# Patient Record
Sex: Male | Born: 1960 | Hispanic: No | Marital: Married | Smoking: Never smoker
Health system: Southern US, Community
[De-identification: ages and names within clinical notes are randomized; demographics above are authoritative.]

## PROBLEM LIST (undated history)

## (undated) DIAGNOSIS — B181 Chronic viral hepatitis B without delta-agent: Secondary | ICD-10-CM

## (undated) DIAGNOSIS — I10 Essential (primary) hypertension: Secondary | ICD-10-CM

## (undated) DIAGNOSIS — E119 Type 2 diabetes mellitus without complications: Secondary | ICD-10-CM

---

## 2001-03-30 ENCOUNTER — Ambulatory Visit (HOSPITAL_BASED_OUTPATIENT_CLINIC_OR_DEPARTMENT_OTHER): Admission: RE | Admit: 2001-03-30 | Discharge: 2001-03-30 | Payer: Self-pay | Admitting: General Surgery

## 2001-03-30 ENCOUNTER — Encounter (INDEPENDENT_AMBULATORY_CARE_PROVIDER_SITE_OTHER): Payer: Self-pay | Admitting: *Deleted

## 2006-05-11 ENCOUNTER — Emergency Department (HOSPITAL_COMMUNITY): Admission: EM | Admit: 2006-05-11 | Discharge: 2006-05-11 | Payer: Self-pay | Admitting: Emergency Medicine

## 2009-12-10 ENCOUNTER — Encounter: Admission: RE | Admit: 2009-12-10 | Discharge: 2009-12-10 | Payer: Self-pay | Admitting: Gastroenterology

## 2011-04-04 NOTE — Op Note (Signed)
[place]. [facility]  Patient:    Randy Vaughan, Randy Vaughan                          MRN: [id number] Proc. Date: 03/30/01 Adm. Date:  [id number] Attending:  Fortino Sic                           Operative Report  PREOPERATIVE DIAGNOSIS:  Anal mass.  POSTOPERATIVE DIAGNOSIS:  Probable inflamed hemorrhoid.  OPERATION PERFORMED:  Proctohemorrhoidectomy.  SURGEON:  Marnee Spring. Wiliam Ke, M.D.  ASSISTANT:  None.  ANESTHESIA:  General.  DESCRIPTION OF PROCEDURE:  Under good general anesthesia and dorsal lithotomy position the skin of the perineum was prepped and draped in the usual sterile manner.  Procto to 15 cm was negative save for the outlet findings.  the patient had a large polypoid mass in the anal verge based near the posterior midline.  This went to the dentate line and out to the anoderm and appeared to be an inflamed piece of tissue with a fecalith involved.  It was approximately 2 x 2 cm.  The tissue was infiltrated with Xylocaine and Marcaine anesthesia with epinephrine.  A 3-0 chromic suture was placed proximally in the rectum. It was tied and held in place.  The mass was then excised and the wound that resulted was closed using the 3-0 chromic suture in a running interlocking fashion.  Hemostasis was good, the wound was irrigated and examined again. The rest of the anal circumference was normal.  The wound was dressed with Nupercainal ointment and 4 x 4.  Estimated blood loss for this procedure was minimal.  The patient received no blood.  He left the operating room in stable condition after sponge, needle counts were verified. DD:  03/30/01 TD:  03/30/01 Job: [id number]

## 2011-04-10 ENCOUNTER — Inpatient Hospital Stay (HOSPITAL_COMMUNITY)
Admission: EM | Admit: 2011-04-10 | Discharge: 2011-04-11 | DRG: 287 | Disposition: A | Discharge status: Home / Self Care | Payer: Self-pay | Attending: Cardiology | Admitting: Cardiology

## 2011-04-10 DIAGNOSIS — Z7982 Long term (current) use of aspirin: Secondary | ICD-10-CM

## 2011-04-10 DIAGNOSIS — K219 Gastro-esophageal reflux disease without esophagitis: Secondary | ICD-10-CM | POA: Diagnosis present

## 2011-04-10 DIAGNOSIS — I119 Hypertensive heart disease without heart failure: Principal | ICD-10-CM | POA: Diagnosis present

## 2011-04-11 LAB — POCT I-STAT, CHEM 8
Creatinine, Ser: 0.8 mg/dL (ref 0.4–1.5)
Glucose, Bld: 209 mg/dL — ABNORMAL HIGH (ref 70–99)
Hemoglobin: 15 g/dL (ref 13.0–17.0)
TCO2: 26 mmol/L (ref 0–100)

## 2011-04-11 LAB — BASIC METABOLIC PANEL
BUN: 11 mg/dL (ref 6–23)
CO2: 28 mEq/L (ref 19–32)
Chloride: 101 mEq/L (ref 96–112)
Creatinine, Ser: 0.86 mg/dL (ref 0.4–1.5)
GFR calc non Af Amer: >60 mL/min (ref >60)
Glucose, Bld: 210 mg/dL — ABNORMAL HIGH (ref 70–99)
Potassium: 4 mEq/L (ref 3.5–5.1)
Sodium: 137 mEq/L (ref 135–145)

## 2011-04-11 LAB — CBC
HCT: 35.5 % — ABNORMAL LOW (ref 39.0–52.0)
MCH: 22.9 pg — ABNORMAL LOW (ref 26.0–34.0)
MCH: 23.9 pg — ABNORMAL LOW (ref 26.0–34.0)
MCHC: 31.4 g/dL (ref 30.0–36.0)
MCHC: 32.8 g/dL (ref 30.0–36.0)
MCV: 73 fL — ABNORMAL LOW (ref 78.0–100.0)
MCV: 73.1 fL — ABNORMAL LOW (ref 78.0–100.0)
Platelets: 129 10*3/uL — ABNORMAL LOW (ref 150–400)
Platelets: 137 10*3/uL — ABNORMAL LOW (ref 150–400)
Platelets: 147 10*3/uL — ABNORMAL LOW (ref 150–400)
RBC: 4.93 MIL/uL (ref 4.22–5.81)
RDW: 15 % (ref 11.5–15.5)
RDW: 15.1 % (ref 11.5–15.5)
RDW: 15.1 % (ref 11.5–15.5)
WBC: 3 10*3/uL — ABNORMAL LOW (ref 4.0–10.5)

## 2011-04-11 LAB — LIPID PANEL: Triglycerides: 87 mg/dL (ref <150)

## 2011-04-11 LAB — CARDIAC PANEL(CRET KIN+CKTOT+MB+TROPI)
Relative Index: 0.8 (ref 0.0–2.5)
Total CK: 202 U/L (ref 7–232)
Total CK: 180 U/L (ref 7–232)
Troponin I: <0.3 ng/mL (ref <0.30)
Troponin I: <0.3 ng/mL (ref <0.30)

## 2011-04-11 LAB — COMPREHENSIVE METABOLIC PANEL
AST: 67 U/L — ABNORMAL HIGH (ref 0–37)
CO2: 26 mEq/L (ref 19–32)
Chloride: 102 mEq/L (ref 96–112)
Creatinine, Ser: 0.75 mg/dL (ref 0.4–1.5)
GFR calc Af Amer: >60 mL/min (ref >60)
GFR calc non Af Amer: >60 mL/min (ref >60)
Glucose, Bld: 207 mg/dL — ABNORMAL HIGH (ref 70–99)
Total Bilirubin: 0.3 mg/dL (ref 0.3–1.2)

## 2011-04-11 LAB — HEMOGLOBIN A1C: Hgb A1c MFr Bld: 5.3 % (ref <5.7)

## 2011-04-11 LAB — PROTIME-INR: Prothrombin Time: 15.2 seconds (ref 11.6–15.2)

## 2011-04-11 LAB — DIFFERENTIAL
Basophils Relative: 0 % (ref 0–1)
Eosinophils Absolute: 0.1 10*3/uL (ref 0.0–0.7)
Eosinophils Relative: 4 % (ref 0–5)
Lymphs Abs: 1.1 10*3/uL (ref 0.7–4.0)
Monocytes Relative: 16 % — ABNORMAL HIGH (ref 3–12)

## 2011-05-13 NOTE — H&P (Signed)
  [name], [name]                 ACCOUNT NO.:  [id number]  MEDICAL RECORD NO.:  [id number]           PATIENT TYPE:  I  LOCATION:  2039                         FACILITY:  [facility]  PHYSICIAN:  Pamella Pert, MD DATE OF BIRTH:  Jul 11, 1961  DATE OF ADMISSION:  04/10/2011 DATE OF DISCHARGE:                             HISTORY & PHYSICAL   REASON FOR ADMISSION:  Acute coronary syndrome.  HISTORY:  Mr. Randy Vaughan is a 50 year old gentleman, who has history of hypertension, was on medication for a short period of time, but has otherwise been doing well, who presently not on medications.  He was doing well until around 9 o'clock, felt nauseous and generally weak and tired.  Following this somewhere around 10 o'clock, he had chest tightness associated with nausea and vomiting and also had mild-to- marked diaphoresis.  He had called his friend and presented to the emergency room where EKG revealed minimal ST elevation 1 at aVL, which was not significant for an acute MI.  However, because of his typical symptoms and abnormal EKG, a STEMI was activated and I was called to see the patient.  The patient states he is presently chest pain-free and he feels well.  REVIEW OF SYSTEMS:  He denies any bowel or bladder disturbance, except for nausea, vomiting prior to the hospital presentation.  He has no neurological deficits.  No diabetes.  No bowel or bladder disturbance. Other systems are negative.  PRESENT MEDICATIONS:  None.  ALLERGIES:  No known drug allergies.  PAST MEDICAL HISTORY:  History of hypertension.  FAMILY HISTORY:  There is no history of premature coronary artery disease in the family.  SOCIAL HISTORY:  He is married, lives with his wife.  Does not drink alcohol.  Does not smoke tobacco.  PHYSICAL EXAM:  GENERAL:  He is well built, well nourished, he appears to be in no acute distress. VITAL SIGNS:  Include, temperature of 97.4, pulse is 51 beats per minute and  regular, respirations 14, blood pressure 163/93 mmHg. CARDIAC:  S1-S2 was normal without any gallops or murmur. CHEST:  Clear. ABDOMEN:  Soft. EXTREMITIES:  No edema.  Peripheral arterial examination was normal.  His labs are pending at this time.  Chest x-ray was within normal limits.  EKG demonstrate insignificant ST-elevation 1 at aVL, poor R-wave progression, presence of U-wave.  IMPRESSION: 1. Chest pain, appears atypical but cannot exclude unstable angina,     acute coronary syndrome. 2. Abnormal EKG.  RECOMMENDATION:  The patient will be taken to the cardiac catheterization lab.  One of his close friends is accompanying the patient.  The patient understand less than 1% risk of death, stroke, heart attack, urgent need for bypass surgery, bleeding, and infection, but not limited to these as major complication from coronary artery catheterization.     Pamella Pert, MD     JRG/MEDQ  D:  04/11/2011  T:  04/11/2011  Job:  [id number]  Electronically Signed by Yates Decamp MD on 05/13/2011 10:10:13 AM

## 2011-05-13 NOTE — Cardiovascular Report (Signed)
NAMERAGAN, REALE                 ACCOUNT NO.:  [id number]  MEDICAL RECORD NO.:  [id number]           PATIENT TYPE:  I  LOCATION:  2039                         FACILITY:  [facility]  PHYSICIAN:  Pamella Pert, MD DATE OF BIRTH:  1960/12/27  DATE OF PROCEDURE:  04/11/2011 DATE OF DISCHARGE:                           CARDIAC CATHETERIZATION   PROCEDURE PERFORMED: 1. Left ventriculography. 2. Selective right and left coronary arteriography. 3. Right femoral arteriography and closure of the right femoral     arterial access with Perclose.  INDICATIONS:  Mr. Maki Sweetser is a 50-year gentleman with a prior history of hypertension, who was present, not on medication, was admitted via emergency room with chest pain with nausea, vomiting, and diaphoresis.  EKG had demonstrated very minimal ST elevation 1 at aVL, which is not significant for a STEMI, but because of his atypical presentation a STEMI was activated.  He was brought to the cardiac catheterization lab to evaluate his coronary anatomy.  HEMODYNAMIC DATA:  The left ventricular pressure was 162/9 with end- diastolic pressure of 16 mmHg.  Aortic pressure was 164/96 with a mean of 125 mmHg.  There is no pressure gradient across the aortic valve.  ANGIOGRAPHIC DATA:  The left ventricular systolic function was normal with the ejection fraction of 60%-65% without any regional wall motion abnormality.  Right coronary artery:  The right coronary artery is a large-caliber vessel and a dominant vessel.  It is smooth and normal.  Left main coronary artery:  Left main coronary artery is a large-caliber vessel.  It is smooth and normal.  Circumflex coronary artery:  The circumflex coronary artery is a very large vessel.  It is smooth and norma.  Ramus intermediate:  Ramus intermedius is a very large vessel and has a bifurcating branch.  It is smooth and normal.  LAD:  LAD is a large-caliber vessel.  It gives origin to large  diagonal 1 and a moderate-sized diagonal 2.  It is smooth and normal.  IMPRESSION: 1. Normal coronary arteries and normal left ventricular systolic     function. 2. Elevated left ventricular end-diastolic pressure. 3. Abnormal EKG secondary to hypertension with hypertensive heart     disease.  RECOMMENDATIONS:  The patient will be admitted to the hospital and probably will be discharged home in the morning once his chest pain is resolved.  I suspect either hypertension with hypertensive heart disease and also GERD as an etiology for his chest pain.  TECHNIQUES OF THE PROCEDURE:  Under sterile precautions, using a 6- French right femoral arterial access, 6-French multipurpose B2 catheter was advanced into the ascending aorta, then into the left ventricle. Left ventriculography was performed in the RAO projection.  Catheter pulled into the ascending aorta.  Right coronary was selectively engaged and angiography was performed.  The catheter was then engaged to the left main coronary artery and angiography was performed.  Catheter was then pulled out of body and a 6-French Judkins left 4 diagnostic catheter was utilized to engage the left main coronary artery and angiography was performed.  Catheter was then pulled out of the body in  the usual fashion.  Right femoral arteriography was performed through the artery access sheath and access was closed with Angio-Seal with excellent hemostasis.  The patient tolerated the procedure well.  No immediate complications.     Pamella Pert, MD     JRG/MEDQ  D:  04/11/2011  T:  04/11/2011  Job:  [id number]  Electronically Signed by Yates Decamp MD on 05/13/2011 10:16:46 AM

## 2011-06-07 NOTE — Discharge Summary (Signed)
  Randy Vaughan, Randy Vaughan NO.:  [id number]  MEDICAL RECORD NO.:  [id number]  LOCATION:  2039                         FACILITY:  [facility]  PHYSICIAN:  Pamella Pert, MD DATE OF BIRTH:  05/04/1961  DATE OF ADMISSION:  04/10/2011 DATE OF DISCHARGE:  04/11/2011                              DISCHARGE SUMMARY   DISCHARGE DIAGNOSES: 1. Hypertension with hypertensive heart disease. 2. Abnormal EKG suggestive of left ventricular hypertrophy and early     repolarization. 3. Hyperglycemia with normal hemoglobin A1c.  DISCHARGE MEDICATIONS: 1. Aspirin 81 mg p.o. daily. 2. Amlodipine 10 mg p.o. daily. 3. Pepcid 40 mg p.o. daily. 4. Lisinopril/hydrochlorothiazide 10/12.5 one p.o. daily. 5. Metformin 500 mg p.o. daily.  RECOMMENDATIONS:  The patient will be discharged home today with outpatient followup with [facility] or his primary care physicians.  BRIEF HISTORY:  [name] is a 50 year old gentleman with history of diabetes and hypertension for which he has not taken any medications who was admitted to the hospital with chest pain.  In the emergency room, he was found to have ST-segment elevations versus early repolarization. Hence, he underwent cardiac catheterization in an urgent basis on Apr 11, 2011, which essentially revealed normal LV systolic function and normal coronary arteries.  He was admitted to the hospital and ruled out for myocardial infarction and he was essentially felt to be stable for discharge with control of hypertension.  Laboratory evaluation included a CBC which revealed a hemoglobin of 11.8, hematocrit of 36.0, white count was 3.8 with a platelet count of 147,000.  His cardiac markers were negative for myocardial injury.  His total cholesterol was 203, triglycerides 87, HDL 50, and LDL of 136. His hemoglobin A1c was normal at 5.3 with a mean plasma glucose of 105. However, fasting blood sugars were in the range of 209.  The patient was  discharged home with outpatient followup with [facility].  He will see [place] back on a p.r.n. basis.  Overall, the patient tolerated the procedure well during cardiac catheterization and did not have any periprocedural complications.     Pamella Pert, MD     JRG/MEDQ  D:  05/13/2011  T:  05/14/2011  Job:  [id number]  Electronically Signed by Yates Decamp MD on 06/07/2011 01:40:36 PM

## 2012-01-26 ENCOUNTER — Other Ambulatory Visit: Payer: Self-pay | Admitting: Gastroenterology

## 2012-01-26 DIAGNOSIS — B181 Chronic viral hepatitis B without delta-agent: Secondary | ICD-10-CM

## 2012-02-02 ENCOUNTER — Other Ambulatory Visit: Payer: Self-pay

## 2012-02-05 ENCOUNTER — Ambulatory Visit
Admission: RE | Admit: 2012-02-05 | Discharge: 2012-02-05 | Disposition: A | Discharge status: Home / Self Care | Payer: PRIVATE HEALTH INSURANCE | Source: Ambulatory Visit | Attending: Gastroenterology | Admitting: Gastroenterology

## 2012-02-05 DIAGNOSIS — B181 Chronic viral hepatitis B without delta-agent: Secondary | ICD-10-CM

## 2013-09-05 ENCOUNTER — Other Ambulatory Visit: Payer: Self-pay | Admitting: Gastroenterology

## 2013-09-05 DIAGNOSIS — B181 Chronic viral hepatitis B without delta-agent: Secondary | ICD-10-CM

## 2013-09-09 ENCOUNTER — Other Ambulatory Visit: Payer: PRIVATE HEALTH INSURANCE

## 2013-09-12 ENCOUNTER — Other Ambulatory Visit: Payer: Self-pay | Admitting: Gastroenterology

## 2013-09-12 DIAGNOSIS — R772 Abnormality of alphafetoprotein: Secondary | ICD-10-CM

## 2013-09-13 ENCOUNTER — Other Ambulatory Visit: Payer: Self-pay | Admitting: Gastroenterology

## 2013-09-13 DIAGNOSIS — R772 Abnormality of alphafetoprotein: Secondary | ICD-10-CM

## 2013-09-19 ENCOUNTER — Ambulatory Visit
Admission: RE | Admit: 2013-09-19 | Discharge: 2013-09-19 | Disposition: A | Discharge status: Home / Self Care | Payer: BC Managed Care – PPO | Source: Ambulatory Visit | Attending: Gastroenterology | Admitting: Gastroenterology

## 2013-09-19 DIAGNOSIS — R772 Abnormality of alphafetoprotein: Secondary | ICD-10-CM

## 2013-09-19 MED ORDER — IOHEXOL 350 MG/ML SOLN
125.0000 mL | Freq: Once | INTRAVENOUS | Status: AC | PRN
  Administered 2013-09-19: 125 mL via INTRAVENOUS

## 2014-07-05 ENCOUNTER — Other Ambulatory Visit (HOSPITAL_COMMUNITY): Payer: Self-pay | Admitting: Gastroenterology

## 2014-07-05 DIAGNOSIS — B181 Chronic viral hepatitis B without delta-agent: Secondary | ICD-10-CM

## 2014-07-06 ENCOUNTER — Encounter (HOSPITAL_COMMUNITY): Payer: Self-pay | Admitting: Pharmacy Technician

## 2014-07-07 ENCOUNTER — Other Ambulatory Visit: Payer: Self-pay | Admitting: Radiology

## 2014-07-10 ENCOUNTER — Ambulatory Visit (HOSPITAL_COMMUNITY)
Admission: RE | Admit: 2014-07-10 | Discharge: 2014-07-10 | Disposition: A | Discharge status: Home / Self Care | Payer: BC Managed Care – PPO | Source: Ambulatory Visit | Attending: Gastroenterology | Admitting: Gastroenterology

## 2014-07-10 ENCOUNTER — Encounter (HOSPITAL_COMMUNITY): Payer: Self-pay

## 2014-07-10 DIAGNOSIS — B181 Chronic viral hepatitis B without delta-agent: Secondary | ICD-10-CM | POA: Insufficient documentation

## 2014-07-10 HISTORY — DX: Chronic viral hepatitis B without delta-agent: B18.1

## 2014-07-10 LAB — CBC
HEMATOCRIT: 36.9 % — AB (ref 39.0–52.0)
Hemoglobin: 12 g/dL — ABNORMAL LOW (ref 13.0–17.0)
MCH: 23.9 pg — ABNORMAL LOW (ref 26.0–34.0)
MCHC: 32.5 g/dL (ref 30.0–36.0)
MCV: 73.4 fL — ABNORMAL LOW (ref 78.0–100.0)
PLATELETS: 96 10*3/uL — AB (ref 150–400)
RBC: 5.03 MIL/uL (ref 4.22–5.81)
RDW: 13.7 % (ref 11.5–15.5)
WBC: 2.2 10*3/uL — AB (ref 4.0–10.5)

## 2014-07-10 LAB — PROTIME-INR
INR: 1.19 (ref 0.00–1.49)
Prothrombin Time: 15.1 seconds (ref 11.6–15.2)

## 2014-07-10 LAB — APTT: aPTT: 34 seconds (ref 24–37)

## 2014-07-10 MED ORDER — LIDOCAINE-EPINEPHRINE 1 %-1:100000 IJ SOLN
INTRAMUSCULAR | Status: AC
  Filled 2014-07-10: qty 1

## 2014-07-10 MED ORDER — SODIUM CHLORIDE 0.9 % IV SOLN: Freq: Once | INTRAVENOUS | Status: DC

## 2014-07-10 MED ORDER — MIDAZOLAM HCL 2 MG/2ML IJ SOLN
INTRAMUSCULAR | Status: AC | PRN
  Administered 2014-07-10: 1 mg via INTRAVENOUS

## 2014-07-10 MED ORDER — FENTANYL CITRATE 0.05 MG/ML IJ SOLN
INTRAMUSCULAR | Status: AC | PRN
  Administered 2014-07-10: 50 ug via INTRAVENOUS

## 2014-07-10 MED ORDER — SODIUM CHLORIDE 0.9 % IV SOLN
INTRAVENOUS | Status: AC | PRN
  Administered 2014-07-10: 10 mL/h via INTRAVENOUS

## 2014-07-10 MED ORDER — GELATIN ABSORBABLE 12-7 MM EX MISC
CUTANEOUS | Status: AC
  Filled 2014-07-10: qty 1

## 2014-07-10 MED ORDER — FENTANYL CITRATE 0.05 MG/ML IJ SOLN
INTRAMUSCULAR | Status: AC
  Filled 2014-07-10: qty 2

## 2014-07-10 MED ORDER — MIDAZOLAM HCL 2 MG/2ML IJ SOLN
INTRAMUSCULAR | Status: AC
  Filled 2014-07-10: qty 4

## 2014-07-10 NOTE — H&P (Signed)
Chief Complaint: "I'm here for a liver biopsy" Referring Physician:Hung HPI: Randy Vaughan is an 53 y.o. male with chronic hep B. He is referred for US guided random liver core biopsy PMHx, meds reviewed. Pt has been NPO this am  Past Medical History:  Past Medical History  Diagnosis Date  . Hepatitis B, chronic     Past Surgical History: History reviewed. No pertinent past surgical history.  Family History: No family history on file.  Social History:  has no tobacco, alcohol, and drug history on file.  Allergies: No Known Allergies  Medications:   Medication List    ASK your doctor about these medications       hydrochlorothiazide 25 MG tablet  Commonly known as:  HYDRODIURIL  Take 25 mg by mouth daily.     lovastatin 20 MG tablet  Commonly known as:  MEVACOR  Take 20 mg by mouth at bedtime.        Please HPI for pertinent positives, otherwise complete 10 system ROS negative.  Physical Exam: BP 143/94  Pulse 68  Temp(Src) 98.2 F (36.8 C) (Oral)  Resp 20  Ht  (1.727 m)  Wt 217 lb (98.431 kg)  BMI 33.00 kg/m2  SpO2 100% Body mass index is 33 kg/(m^2).   General Appearance:  Alert, cooperative, no distress, appears stated age  Head:  Normocephalic, without obvious abnormality, atraumatic  ENT: Unremarkable  Neck: Supple, symmetrical, trachea midline  Lungs:   Clear to auscultation bilaterally, no w/r/r, respirations unlabored without use of accessory muscles.  Chest Wall:  No tenderness or deformity  Heart:  Regular rate and rhythm, S1, S2 normal, no murmur, rub or gallop.  Abdomen:   Soft, non-tender, non distended.  Neurologic: Normal affect, no gross deficits.   Results for orders placed during the hospital encounter of 07/10/14 (from the past 48 hour(s))  APTT     Status: None   Collection Time    07/10/14  8:40 AM      Result Value Ref Range   aPTT 34  24 - 37 seconds  CBC     Status: Abnormal (Preliminary result)   Collection Time   07/10/14  8:40 AM      Result Value Ref Range   WBC 2.2 (*) 4.0 - 10.5 K/uL   Comment: REPEATED TO VERIFY   RBC 5.03  4.22 - 5.81 MIL/uL   Hemoglobin 12.0 (*) 13.0 - 17.0 g/dL   Comment: REPEATED TO VERIFY   HCT 36.9 (*) 39.0 - 52.0 %   MCV 73.4 (*) 78.0 - 100.0 fL   MCH 23.9 (*) 26.0 - 34.0 pg   MCHC 32.5  30.0 - 36.0 g/dL   RDW 16.1  09.6 - 04.5 %   Platelets PENDING  150 - 400 K/uL  PROTIME-INR     Status: None   Collection Time    07/10/14  8:40 AM      Result Value Ref Range   Prothrombin Time 15.1  11.6 - 15.2 seconds   INR 1.19  0.00 - 1.49   No results found.  Assessment/Plan Chronic hep B For US guided random liver core biopsy Discussed procedure, risks, complications, use of sedation Labs reviewed Consent signed in chart  Brayton El PA-C 07/10/2014, 9:38 AM

## 2014-07-10 NOTE — Procedures (Signed)
Technically successful US guided biopsy of right lobe of liver.  No immediate complications.   

## 2014-07-10 NOTE — Discharge Instructions (Signed)
Liver Biopsy, Care After °These instructions give you information on caring for yourself after your procedure. Your doctor may also give you more specific instructions. Call your doctor if you have any problems or questions after your procedure. °HOME CARE °· Rest at home for 1-2 days or as told by your doctor. °· Have someone stay with you for at least 24 hours. °· Do not do these things in the first 24 hours: °¨ Drive. °¨ Use machinery. °¨ Take care of other people. °¨ Sign legal documents. °¨ Take a bath or shower. °· There are many different ways to close and cover a cut (incision). For example, a cut can be closed with stitches, skin glue, or adhesive strips. Follow your doctor's instructions on: °¨ Taking care of your cut. °¨ Changing and removing your bandage (dressing). °¨ Removing whatever was used to close your cut. °· Do not drink alcohol in the first week. °· Do not lift more than 5 pounds or play contact sports for the first 2 weeks. °· Take medicines only as told by your doctor. For 1 week, do not take medicine that has aspirin in it or medicines like ibuprofen. °· Get your test results. °GET HELP IF: °· A cut bleeds and leaves more than just a small spot of blood. °· A cut is red, puffs up (swells), or hurts more than before. °· Fluid or something else comes from a cut. °· A cut smells bad. °· You have a fever or chills. °GET HELP RIGHT AWAY IF: °· You have swelling, bloating, or pain in your belly (abdomen). °· You get dizzy or faint. °· You have a rash. °· You feel sick to your stomach (nauseous) or throw up (vomit). °· You have trouble breathing, feel short of breath, or feel faint. °· Your chest hurts. °· You have problems talking or seeing. °· You have trouble balancing or moving your arms or legs. °Document Released: 08/12/2008 Document Revised: 03/20/2014 Document Reviewed: 12/30/2013 °[employer]® Patient Information ©2015 [employer]. This information is not intended to replace advice given to  you by your health care provider. Make sure you discuss any questions you have with your health care provider. ° °

## 2014-11-23 ENCOUNTER — Other Ambulatory Visit: Payer: Self-pay | Admitting: Gastroenterology

## 2014-11-24 ENCOUNTER — Other Ambulatory Visit: Payer: Self-pay | Admitting: Gastroenterology

## 2014-11-24 DIAGNOSIS — R945 Abnormal results of liver function studies: Principal | ICD-10-CM

## 2014-11-24 DIAGNOSIS — B181 Chronic viral hepatitis B without delta-agent: Secondary | ICD-10-CM

## 2014-11-24 DIAGNOSIS — R7989 Other specified abnormal findings of blood chemistry: Secondary | ICD-10-CM

## 2014-12-01 ENCOUNTER — Other Ambulatory Visit: Payer: Self-pay

## 2014-12-05 ENCOUNTER — Ambulatory Visit
Admission: RE | Admit: 2014-12-05 | Discharge: 2014-12-05 | Disposition: A | Discharge status: Home / Self Care | Payer: BLUE CROSS/BLUE SHIELD | Source: Ambulatory Visit | Attending: Gastroenterology | Admitting: Gastroenterology

## 2014-12-05 DIAGNOSIS — B181 Chronic viral hepatitis B without delta-agent: Secondary | ICD-10-CM

## 2014-12-05 DIAGNOSIS — R7989 Other specified abnormal findings of blood chemistry: Secondary | ICD-10-CM

## 2014-12-05 DIAGNOSIS — R945 Abnormal results of liver function studies: Principal | ICD-10-CM

## 2014-12-05 MED ORDER — IOHEXOL 300 MG/ML  SOLN
125.0000 mL | Freq: Once | INTRAMUSCULAR | Status: AC | PRN
  Administered 2014-12-05: 125 mL via INTRAVENOUS

## 2018-03-01 ENCOUNTER — Other Ambulatory Visit: Payer: Self-pay | Admitting: Gastroenterology

## 2018-03-01 DIAGNOSIS — B1910 Unspecified viral hepatitis B without hepatic coma: Secondary | ICD-10-CM

## 2018-03-09 ENCOUNTER — Ambulatory Visit
Admission: RE | Admit: 2018-03-09 | Discharge: 2018-03-09 | Disposition: A | Discharge status: Home / Self Care | Payer: BLUE CROSS/BLUE SHIELD | Source: Ambulatory Visit | Attending: Gastroenterology | Admitting: Gastroenterology

## 2018-03-09 DIAGNOSIS — B1910 Unspecified viral hepatitis B without hepatic coma: Secondary | ICD-10-CM

## 2018-03-11 ENCOUNTER — Other Ambulatory Visit: Payer: Self-pay | Admitting: Gastroenterology

## 2018-03-11 DIAGNOSIS — K769 Liver disease, unspecified: Secondary | ICD-10-CM

## 2018-03-20 ENCOUNTER — Ambulatory Visit
Admission: RE | Admit: 2018-03-20 | Discharge: 2018-03-20 | Disposition: A | Discharge status: Home / Self Care | Payer: BLUE CROSS/BLUE SHIELD | Source: Ambulatory Visit | Attending: Gastroenterology | Admitting: Gastroenterology

## 2018-03-20 DIAGNOSIS — K769 Liver disease, unspecified: Secondary | ICD-10-CM

## 2018-03-20 MED ORDER — GADOBENATE DIMEGLUMINE 529 MG/ML IV SOLN
18.0000 mL | Freq: Once | INTRAVENOUS | Status: AC | PRN
  Administered 2018-03-20: 18 mL via INTRAVENOUS

## 2018-09-06 ENCOUNTER — Other Ambulatory Visit: Payer: Self-pay | Admitting: Gastroenterology

## 2018-09-06 DIAGNOSIS — K769 Liver disease, unspecified: Secondary | ICD-10-CM

## 2018-09-20 ENCOUNTER — Other Ambulatory Visit: Payer: BLUE CROSS/BLUE SHIELD

## 2018-10-11 ENCOUNTER — Other Ambulatory Visit: Payer: BLUE CROSS/BLUE SHIELD

## 2019-03-30 ENCOUNTER — Inpatient Hospital Stay (HOSPITAL_COMMUNITY): Payer: BLUE CROSS/BLUE SHIELD

## 2019-03-30 ENCOUNTER — Inpatient Hospital Stay (HOSPITAL_COMMUNITY)
Admission: EM | Admit: 2019-03-30 | Discharge: 2019-04-18 | DRG: 871 | Disposition: E | Payer: BLUE CROSS/BLUE SHIELD | Attending: Internal Medicine | Admitting: Internal Medicine

## 2019-03-30 ENCOUNTER — Encounter (HOSPITAL_COMMUNITY): Payer: Self-pay | Admitting: Emergency Medicine

## 2019-03-30 ENCOUNTER — Other Ambulatory Visit: Payer: Self-pay

## 2019-03-30 ENCOUNTER — Emergency Department (HOSPITAL_COMMUNITY): Payer: BLUE CROSS/BLUE SHIELD

## 2019-03-30 DIAGNOSIS — E7220 Disorder of urea cycle metabolism, unspecified: Secondary | ICD-10-CM | POA: Diagnosis not present

## 2019-03-30 DIAGNOSIS — B1910 Unspecified viral hepatitis B without hepatic coma: Secondary | ICD-10-CM

## 2019-03-30 DIAGNOSIS — R6521 Severe sepsis with septic shock: Secondary | ICD-10-CM | POA: Diagnosis not present

## 2019-03-30 DIAGNOSIS — D61818 Other pancytopenia: Secondary | ICD-10-CM | POA: Diagnosis present

## 2019-03-30 DIAGNOSIS — E8770 Fluid overload, unspecified: Secondary | ICD-10-CM | POA: Diagnosis present

## 2019-03-30 DIAGNOSIS — I4891 Unspecified atrial fibrillation: Secondary | ICD-10-CM | POA: Diagnosis present

## 2019-03-30 DIAGNOSIS — B181 Chronic viral hepatitis B without delta-agent: Secondary | ICD-10-CM | POA: Diagnosis present

## 2019-03-30 DIAGNOSIS — A4150 Gram-negative sepsis, unspecified: Secondary | ICD-10-CM | POA: Diagnosis present

## 2019-03-30 DIAGNOSIS — M7989 Other specified soft tissue disorders: Secondary | ICD-10-CM

## 2019-03-30 DIAGNOSIS — Z79899 Other long term (current) drug therapy: Secondary | ICD-10-CM | POA: Diagnosis not present

## 2019-03-30 DIAGNOSIS — N179 Acute kidney failure, unspecified: Secondary | ICD-10-CM | POA: Diagnosis present

## 2019-03-30 DIAGNOSIS — E871 Hypo-osmolality and hyponatremia: Secondary | ICD-10-CM | POA: Diagnosis present

## 2019-03-30 DIAGNOSIS — R000 Tachycardia, unspecified: Secondary | ICD-10-CM

## 2019-03-30 DIAGNOSIS — N3941 Urge incontinence: Secondary | ICD-10-CM

## 2019-03-30 DIAGNOSIS — K7460 Unspecified cirrhosis of liver: Secondary | ICD-10-CM | POA: Diagnosis present

## 2019-03-30 DIAGNOSIS — E875 Hyperkalemia: Secondary | ICD-10-CM | POA: Diagnosis not present

## 2019-03-30 DIAGNOSIS — Z20828 Contact with and (suspected) exposure to other viral communicable diseases: Secondary | ICD-10-CM | POA: Diagnosis present

## 2019-03-30 DIAGNOSIS — D689 Coagulation defect, unspecified: Secondary | ICD-10-CM | POA: Diagnosis not present

## 2019-03-30 DIAGNOSIS — Z4659 Encounter for fitting and adjustment of other gastrointestinal appliance and device: Secondary | ICD-10-CM

## 2019-03-30 DIAGNOSIS — E11649 Type 2 diabetes mellitus with hypoglycemia without coma: Secondary | ICD-10-CM | POA: Diagnosis not present

## 2019-03-30 DIAGNOSIS — Z7984 Long term (current) use of oral hypoglycemic drugs: Secondary | ICD-10-CM

## 2019-03-30 DIAGNOSIS — E1165 Type 2 diabetes mellitus with hyperglycemia: Secondary | ICD-10-CM

## 2019-03-30 DIAGNOSIS — K7469 Other cirrhosis of liver: Secondary | ICD-10-CM | POA: Diagnosis not present

## 2019-03-30 DIAGNOSIS — M79661 Pain in right lower leg: Secondary | ICD-10-CM

## 2019-03-30 DIAGNOSIS — K7290 Hepatic failure, unspecified without coma: Secondary | ICD-10-CM

## 2019-03-30 DIAGNOSIS — R601 Generalized edema: Secondary | ICD-10-CM | POA: Diagnosis present

## 2019-03-30 DIAGNOSIS — R188 Other ascites: Secondary | ICD-10-CM | POA: Diagnosis present

## 2019-03-30 DIAGNOSIS — I469 Cardiac arrest, cause unspecified: Secondary | ICD-10-CM | POA: Diagnosis not present

## 2019-03-30 DIAGNOSIS — M79662 Pain in left lower leg: Secondary | ICD-10-CM

## 2019-03-30 DIAGNOSIS — Z452 Encounter for adjustment and management of vascular access device: Secondary | ICD-10-CM

## 2019-03-30 DIAGNOSIS — K7200 Acute and subacute hepatic failure without coma: Secondary | ICD-10-CM | POA: Diagnosis present

## 2019-03-30 DIAGNOSIS — B182 Chronic viral hepatitis C: Secondary | ICD-10-CM | POA: Diagnosis present

## 2019-03-30 DIAGNOSIS — I471 Supraventricular tachycardia: Secondary | ICD-10-CM | POA: Diagnosis not present

## 2019-03-30 DIAGNOSIS — M25511 Pain in right shoulder: Secondary | ICD-10-CM | POA: Diagnosis present

## 2019-03-30 DIAGNOSIS — J9601 Acute respiratory failure with hypoxia: Secondary | ICD-10-CM | POA: Diagnosis not present

## 2019-03-30 DIAGNOSIS — I10 Essential (primary) hypertension: Secondary | ICD-10-CM | POA: Diagnosis present

## 2019-03-30 DIAGNOSIS — J9690 Respiratory failure, unspecified, unspecified whether with hypoxia or hypercapnia: Secondary | ICD-10-CM

## 2019-03-30 HISTORY — DX: Type 2 diabetes mellitus without complications: E11.9

## 2019-03-30 HISTORY — DX: Essential (primary) hypertension: I10

## 2019-03-30 LAB — GLUCOSE, CAPILLARY
Glucose-Capillary: 72 mg/dL (ref 70–99)
Glucose-Capillary: 37 mg/dL — CL (ref 70–99)
Glucose-Capillary: 34 mg/dL — CL (ref 70–99)
Glucose-Capillary: 98 mg/dL (ref 70–99)

## 2019-03-30 LAB — URINALYSIS, ROUTINE W REFLEX MICROSCOPIC
Glucose, UA: >=500 mg/dL — AB
Ketones, ur: NEGATIVE mg/dL
Leukocytes,Ua: NEGATIVE
Nitrite: NEGATIVE
Protein, ur: 30 mg/dL — AB
Specific Gravity, Urine: 1.023 (ref 1.005–1.030)
pH: 5 (ref 5.0–8.0)

## 2019-03-30 LAB — CBC WITH DIFFERENTIAL/PLATELET
Abs Immature Granulocytes: 0.32 10*3/uL — ABNORMAL HIGH (ref 0.00–0.07)
Basophils Absolute: 0 10*3/uL (ref 0.0–0.1)
Basophils Relative: 1 %
Eosinophils Absolute: 0.8 10*3/uL — ABNORMAL HIGH (ref 0.0–0.5)
Eosinophils Relative: 27 %
HCT: 27.7 % — ABNORMAL LOW (ref 39.0–52.0)
Hemoglobin: 9.3 g/dL — ABNORMAL LOW (ref 13.0–17.0)
Immature Granulocytes: 11 %
Lymphocytes Relative: 8 %
Lymphs Abs: 0.3 10*3/uL — ABNORMAL LOW (ref 0.7–4.0)
MCH: 25.1 pg — ABNORMAL LOW (ref 26.0–34.0)
MCHC: 33.6 g/dL (ref 30.0–36.0)
MCV: 74.9 fL — ABNORMAL LOW (ref 80.0–100.0)
Monocytes Absolute: 0.2 10*3/uL (ref 0.1–1.0)
Monocytes Relative: 7 %
Neutro Abs: 1.4 10*3/uL — ABNORMAL LOW (ref 1.7–7.7)
Neutrophils Relative %: 46 %
Platelets: 54 10*3/uL — ABNORMAL LOW (ref 150–400)
RBC: 3.7 MIL/uL — ABNORMAL LOW (ref 4.22–5.81)
RDW: 20.7 % — ABNORMAL HIGH (ref 11.5–15.5)
WBC: 3 10*3/uL — ABNORMAL LOW (ref 4.0–10.5)
nRBC: 0 % (ref 0.0–0.2)

## 2019-03-30 LAB — COMPREHENSIVE METABOLIC PANEL
ALT: 78 U/L — ABNORMAL HIGH (ref 0–44)
ALT: 71 U/L — ABNORMAL HIGH (ref 0–44)
AST: 124 U/L — ABNORMAL HIGH (ref 15–41)
AST: 117 U/L — ABNORMAL HIGH (ref 15–41)
Albumin: 1.7 g/dL — ABNORMAL LOW (ref 3.5–5.0)
Albumin: 1.7 g/dL — ABNORMAL LOW (ref 3.5–5.0)
Alkaline Phosphatase: 96 U/L (ref 38–126)
Alkaline Phosphatase: 70 U/L (ref 38–126)
Anion gap: 16 — ABNORMAL HIGH (ref 5–15)
Anion gap: 18 — ABNORMAL HIGH (ref 5–15)
BUN: 33 mg/dL — ABNORMAL HIGH (ref 6–20)
BUN: 42 mg/dL — ABNORMAL HIGH (ref 6–20)
CO2: 13 mmol/L — ABNORMAL LOW (ref 22–32)
CO2: 11 mmol/L — ABNORMAL LOW (ref 22–32)
Calcium: 8.1 mg/dL — ABNORMAL LOW (ref 8.9–10.3)
Calcium: 8.2 mg/dL — ABNORMAL LOW (ref 8.9–10.3)
Chloride: 102 mmol/L (ref 98–111)
Chloride: 103 mmol/L (ref 98–111)
Creatinine, Ser: 1.87 mg/dL — ABNORMAL HIGH (ref 0.61–1.24)
Creatinine, Ser: 2.72 mg/dL — ABNORMAL HIGH (ref 0.61–1.24)
GFR calc Af Amer: 45 mL/min — ABNORMAL LOW (ref >60)
GFR calc Af Amer: 28 mL/min — ABNORMAL LOW (ref >60)
GFR calc non Af Amer: 38 mL/min — ABNORMAL LOW (ref >60)
GFR calc non Af Amer: 24 mL/min — ABNORMAL LOW (ref >60)
Glucose, Bld: 305 mg/dL — ABNORMAL HIGH (ref 70–99)
Glucose, Bld: 113 mg/dL — ABNORMAL HIGH (ref 70–99)
Potassium: 4.2 mmol/L (ref 3.5–5.1)
Potassium: 4.2 mmol/L (ref 3.5–5.1)
Sodium: 131 mmol/L — ABNORMAL LOW (ref 135–145)
Sodium: 132 mmol/L — ABNORMAL LOW (ref 135–145)
Total Bilirubin: 7.5 mg/dL — ABNORMAL HIGH (ref 0.3–1.2)
Total Bilirubin: 7 mg/dL — ABNORMAL HIGH (ref 0.3–1.2)
Total Protein: 6.6 g/dL (ref 6.5–8.1)
Total Protein: 6.2 g/dL — ABNORMAL LOW (ref 6.5–8.1)

## 2019-03-30 LAB — CBG MONITORING, ED
Glucose-Capillary: 191 mg/dL — ABNORMAL HIGH (ref 70–99)
Glucose-Capillary: 129 mg/dL — ABNORMAL HIGH (ref 70–99)
Glucose-Capillary: 104 mg/dL — ABNORMAL HIGH (ref 70–99)
Glucose-Capillary: 44 mg/dL — CL (ref 70–99)
Glucose-Capillary: 37 mg/dL — CL (ref 70–99)
Glucose-Capillary: 94 mg/dL (ref 70–99)

## 2019-03-30 LAB — SARS CORONAVIRUS 2 BY RT PCR (HOSPITAL ORDER, PERFORMED IN ~~LOC~~ HOSPITAL LAB): SARS Coronavirus 2: NEGATIVE

## 2019-03-30 LAB — PROTIME-INR
INR: 2.9 — ABNORMAL HIGH (ref 0.8–1.2)
Prothrombin Time: 30.1 seconds — ABNORMAL HIGH (ref 11.4–15.2)

## 2019-03-30 LAB — CK: Total CK: 124 U/L (ref 49–397)

## 2019-03-30 LAB — HEMOGLOBIN A1C
Hgb A1c MFr Bld: 8.3 % — ABNORMAL HIGH (ref 4.8–5.6)
Mean Plasma Glucose: 191.51 mg/dL

## 2019-03-30 LAB — MRSA PCR SCREENING: MRSA by PCR: NEGATIVE

## 2019-03-30 LAB — TROPONIN I: Troponin I: <0.03 ng/mL (ref <0.03)

## 2019-03-30 LAB — AMMONIA: Ammonia: 60 umol/L — ABNORMAL HIGH (ref 9–35)

## 2019-03-30 MED ORDER — SODIUM CHLORIDE 0.9 % IV SOLN: INTRAVENOUS | Status: DC

## 2019-03-30 MED ORDER — ALBUMIN HUMAN 5 % IV SOLN
12.5000 g | Freq: Four times a day (QID) | INTRAVENOUS | Status: AC
  Administered 2019-03-30 (×2): 12.5 g via INTRAVENOUS
  Filled 2019-03-30 (×2): qty 250

## 2019-03-30 MED ORDER — AMIODARONE LOAD VIA INFUSION
150.0000 mg | Freq: Once | INTRAVENOUS | Status: DC
  Filled 2019-03-30: qty 83.34

## 2019-03-30 MED ORDER — SODIUM CHLORIDE 0.9 % IV SOLN
2.0000 g | INTRAVENOUS | Status: DC
  Administered 2019-03-31: 2 g via INTRAVENOUS
  Filled 2019-03-30: qty 20

## 2019-03-30 MED ORDER — OCTREOTIDE ACETATE 100 MCG/ML IJ SOLN
100.0000 ug | Freq: Three times a day (TID) | INTRAMUSCULAR | Status: DC
  Administered 2019-03-30: 100 ug via SUBCUTANEOUS
  Filled 2019-03-30 (×4): qty 1

## 2019-03-30 MED ORDER — INSULIN ASPART 100 UNIT/ML ~~LOC~~ SOLN
0.0000 [IU] | SUBCUTANEOUS | Status: DC
  Administered 2019-03-30: 2 [IU] via SUBCUTANEOUS
  Administered 2019-03-30: 11:00:00 3 [IU] via SUBCUTANEOUS

## 2019-03-30 MED ORDER — ALBUMIN HUMAN 5 % IV SOLN
12.5000 g | Freq: Once | INTRAVENOUS | Status: DC
  Filled 2019-03-30: qty 250

## 2019-03-30 MED ORDER — SODIUM CHLORIDE 0.9 % IV BOLUS
500.0000 mL | Freq: Once | INTRAVENOUS | Status: AC
  Administered 2019-03-30: 500 mL via INTRAVENOUS

## 2019-03-30 MED ORDER — RIFAXIMIN 550 MG PO TABS
550.0000 mg | ORAL_TABLET | Freq: Two times a day (BID) | ORAL | Status: DC
  Administered 2019-03-30: 21:00:00 550 mg via ORAL
  Filled 2019-03-30: qty 1

## 2019-03-30 MED ORDER — ALBUMIN HUMAN 5 % IV SOLN
12.5000 g | Freq: Once | INTRAVENOUS | Status: AC
  Administered 2019-03-30: 12.5 g via INTRAVENOUS
  Filled 2019-03-30: qty 250

## 2019-03-30 MED ORDER — AMIODARONE HCL IN DEXTROSE 360-4.14 MG/200ML-% IV SOLN
60.0000 mg/h | INTRAVENOUS | Status: DC
  Filled 2019-03-30: qty 200

## 2019-03-30 MED ORDER — TENOFOVIR ALAFENAMIDE FUMARATE 25 MG PO TABS
1.0000 | ORAL_TABLET | Freq: Every day | ORAL | Status: DC
  Filled 2019-03-30: qty 1

## 2019-03-30 MED ORDER — DILTIAZEM HCL-DEXTROSE 100-5 MG/100ML-% IV SOLN (PREMIX): 5.0000 mg/h | INTRAVENOUS | Status: DC

## 2019-03-30 MED ORDER — DEXTROSE 10 % IV SOLN
INTRAVENOUS | Status: DC
  Administered 2019-03-30: via INTRAVENOUS

## 2019-03-30 MED ORDER — DEXTROSE 50 % IV SOLN
50.0000 mL | Freq: Once | INTRAVENOUS | Status: AC
  Administered 2019-03-30: 50 mL via INTRAVENOUS
  Filled 2019-03-30: qty 50

## 2019-03-30 MED ORDER — DEXTROSE 10 % IV SOLN
INTRAVENOUS | Status: AC
  Administered 2019-03-30: 18:00:00 via INTRAVENOUS

## 2019-03-30 MED ORDER — IOHEXOL 350 MG/ML SOLN
80.0000 mL | Freq: Once | INTRAVENOUS | Status: AC | PRN
  Administered 2019-03-30: 80 mL via INTRAVENOUS

## 2019-03-30 MED ORDER — ALBUMIN HUMAN 5 % IV SOLN: 12.5000 g | Freq: Four times a day (QID) | INTRAVENOUS | Status: DC

## 2019-03-30 MED ORDER — DEXTROSE 50 % IV SOLN
INTRAVENOUS | Status: AC
  Administered 2019-03-30: 50 mL
  Filled 2019-03-30: qty 50

## 2019-03-30 MED ORDER — AMIODARONE HCL IN DEXTROSE 360-4.14 MG/200ML-% IV SOLN
30.0000 mg/h | INTRAVENOUS | Status: DC
  Filled 2019-03-30: qty 200

## 2019-03-30 MED ORDER — MIDODRINE HCL 5 MG PO TABS
5.0000 mg | ORAL_TABLET | Freq: Three times a day (TID) | ORAL | Status: DC
  Filled 2019-03-30 (×3): qty 1

## 2019-03-30 MED ORDER — PNEUMOCOCCAL VAC POLYVALENT 25 MCG/0.5ML IJ INJ: 0.5000 mL | INJECTION | INTRAMUSCULAR | Status: DC

## 2019-03-30 MED ORDER — ENSURE ENLIVE PO LIQD: 237.0000 mL | Freq: Two times a day (BID) | ORAL | Status: DC

## 2019-03-30 NOTE — ED Notes (Signed)
Confirm with Fleet Contras, pharmd that albumin and 10% dextrose are compatible

## 2019-03-30 NOTE — ED Notes (Signed)
ED TO INPATIENT HANDOFF REPORT  ED Nurse Name and Phone #: Romeo Apple [phone]  S Name/Age/Gender Randy Vaughan 58 y.o. male Room/Bed: 025C/025C  Code Status   Code Status: Prior  Home/SNF/Other Home Patient oriented to: self, place, time and situation Is this baseline? Yes   Triage Complete: Triage complete  Chief Complaint shoulder pain  Triage Note From home with c/o of right shoulder pain X 2 days and soreness to both right and left calves.    Allergies No Known Allergies  Level of Care/Admitting Diagnosis ED Disposition    None      B Medical/Surgery History Past Medical History:  Diagnosis Date  . Diabetes mellitus without complication (HCC)   . Hepatitis B, chronic (HCC)   . Hypertension    History reviewed. No pertinent surgical history.   A IV Location/Drains/Wounds Patient Lines/Drains/Airways Status   Active Line/Drains/Airways    Name:   Placement date:   Placement time:   Site:   Days:   Peripheral IV 03/21/2019 Right;Upper Forearm   04/12/2019    0336    Forearm   less than 1   Incision (Closed) 07/10/14 Abdomen Right;Lateral   07/10/14    1024     1724          Intake/Output Last 24 hours No intake or output data in the 24 hours ending 03/21/2019 4540  Labs/Imaging Results for orders placed or performed during the hospital encounter of 04/15/2019 (from the past 48 hour(s))  CBC with Differential/Platelet     Status: Abnormal   Collection Time: 03/24/2019  3:23 AM  Result Value Ref Range   WBC 3.0 (L) 4.0 - 10.5 K/uL   RBC 3.70 (L) 4.22 - 5.81 MIL/uL   Hemoglobin 9.3 (L) 13.0 - 17.0 g/dL   HCT 98.1 (L) 19.1 - 47.8 %   MCV 74.9 (L) 80.0 - 100.0 fL   MCH 25.1 (L) 26.0 - 34.0 pg   MCHC 33.6 30.0 - 36.0 g/dL   RDW 29.5 (H) 62.1 - 30.8 %   Platelets 54 (L) 150 - 400 K/uL    Comment: REPEATED TO VERIFY PLATELET COUNT CONFIRMED BY SMEAR SPECIMEN CHECKED FOR CLOTS Immature Platelet Fraction may be clinically indicated, consider ordering this  additional test [id number]    nRBC 0.0 0.0 - 0.2 %   Neutrophils Relative % 46 %   Neutro Abs 1.4 (L) 1.7 - 7.7 K/uL   Lymphocytes Relative 8 %   Lymphs Abs 0.3 (L) 0.7 - 4.0 K/uL   Monocytes Relative 7 %   Monocytes Absolute 0.2 0.1 - 1.0 K/uL   Eosinophils Relative 27 %   Eosinophils Absolute 0.8 (H) 0.0 - 0.5 K/uL   Basophils Relative 1 %   Basophils Absolute 0.0 0.0 - 0.1 K/uL   WBC Morphology MILD LEFT SHIFT (1-5% METAS, OCC MYELO, OCC BANDS)    Immature Granulocytes 11 %   Abs Immature Granulocytes 0.32 (H) 0.00 - 0.07 K/uL   Polychromasia PRESENT    Target Cells PRESENT     Comment: Performed at [facility], [address], [place], [place] [address]  Troponin I - ONCE - STAT     Status: None   Collection Time: 04/12/2019  3:23 AM  Result Value Ref Range   Troponin I <0.03 <0.03 ng/mL    Comment: Performed at [facility], [address], [place], [place] [address]  Comprehensive metabolic panel     Status: Abnormal   Collection Time: 04/15/2019  3:23 AM  Result Value Ref Range   Sodium 131 (L) 135 - 145 mmol/L   Potassium 4.2 3.5 - 5.1 mmol/L   Chloride 102 98 - 111 mmol/L   CO2 13 (L) 22 - 32 mmol/L   Glucose, Bld 305 (H) 70 - 99 mg/dL   BUN 33 (H) 6 - 20 mg/dL   Creatinine, Ser 1.61 (H) 0.61 - 1.24 mg/dL   Calcium 8.1 (L) 8.9 - 10.3 mg/dL   Total Protein 6.6 6.5 - 8.1 g/dL   Albumin 1.7 (L) 3.5 - 5.0 g/dL   AST 096 (H) 15 - 41 U/L   ALT 78 (H) 0 - 44 U/L   Alkaline Phosphatase 96 38 - 126 U/L   Total Bilirubin 7.5 (H) 0.3 - 1.2 mg/dL   GFR calc non Af Amer 38 (L) >60 mL/min   GFR calc Af Amer 45 (L) >60 mL/min   Anion gap 16 (H) 5 - 15    Comment: Performed at [facility], [address] [place], [place] [address]  CK     Status: None   Collection Time: 03/29/2019  3:23 AM  Result Value Ref Range   Total CK 124 49 - 397 U/L    Comment: Performed at [facility], [address] [place] [address] [place], [place] [address]  Protime-INR     Status: Abnormal    Collection Time: 04/03/2019  3:23 AM  Result Value Ref Range   Prothrombin Time 30.1 (H) 11.4 - 15.2 seconds   INR 2.9 (H) 0.8 - 1.2    Comment: (NOTE) INR goal varies based on device and disease states. Performed at [facility], [address] [place], [place] [address]   Ammonia     Status: Abnormal   Collection Time: 04/05/2019  3:40 AM  Result Value Ref Range   Ammonia 60 (H) 9 - 35 umol/L    Comment: Performed at [facility], [address] [place], [place] [address]  SARS Coronavirus 2 [facility] order, Performed in [facility] hospital lab)     Status: None   Collection Time: 03/18/2019  3:40 AM  Result Value Ref Range   SARS Coronavirus 2 NEGATIVE NEGATIVE    Comment: (NOTE) If result is NEGATIVE SARS-CoV-2 target nucleic acids are NOT DETECTED. The SARS-CoV-2 RNA is generally detectable in upper and lower  respiratory specimens during the acute phase of infection. The lowest  concentration of SARS-CoV-2 viral copies this assay can detect is 250  copies / mL. A negative result does not preclude SARS-CoV-2 infection  and should not be used as the sole basis for treatment or other  patient management decisions.  A negative result may occur with  improper specimen collection / handling, submission of specimen other  than nasopharyngeal swab, presence of viral mutation(s) within the  areas targeted by this assay, and inadequate number of viral copies  (<250 copies / mL). A negative result must be combined with clinical  observations, patient history, and epidemiological information. If result is POSITIVE SARS-CoV-2 target nucleic acids are DETECTED. The SARS-CoV-2 RNA is generally detectable in upper and lower  respiratory specimens dur ing the acute phase of infection.  Positive  results are indicative of active infection with SARS-CoV-2.  Clinical  correlation with patient history and other diagnostic information is  necessary to determine patient infection  status.  Positive results do  not rule out bacterial infection or co-infection with other viruses. If result is PRESUMPTIVE POSTIVE  SARS-CoV-2 nucleic acids MAY BE PRESENT.   A presumptive positive result was obtained on the submitted specimen  and confirmed on repeat testing.  While 2019 novel coronavirus  (SARS-CoV-2) nucleic acids may be present in the submitted sample  additional confirmatory testing may be necessary for epidemiological  and / or clinical management purposes  to differentiate between  SARS-CoV-2 and other Sarbecovirus currently known to infect humans.  If clinically indicated additional testing with an alternate test  methodology [phone]) is advised. The SARS-CoV-2 RNA is generally  detectable in upper and lower respiratory sp ecimens during the acute  phase of infection. The expected result is Negative. Fact Sheet for Patients:  BoilerBrush.com.cy Fact Sheet for Healthcare Providers: https://pope.com/ This test is not yet approved or cleared by the [place] FDA and has been authorized for detection and/or diagnosis of SARS-CoV-2 by FDA under an Emergency Use Authorization (EUA).  This EUA will remain in effect (meaning this test can be used) for the duration of the COVID-19 declaration under Section 564(b)(1) of the Act, 21 U.S.C. section 360bbb-3(b)(1), unless the authorization is terminated or revoked sooner. Performed at [facility], [address], [place], [place] [address]    Ct Angio Chest Pe W And/or Wo Contrast  Result Date: 04/16/2019 CLINICAL DATA:  58 year old male with right side chest pain and shortness of breath for 2 days. Bilateral lower extremity swelling. EXAM: CT ANGIOGRAPHY CHEST WITH CONTRAST TECHNIQUE: Multidetector CT imaging of the chest was performed using the standard protocol during bolus administration of intravenous contrast. Multiplanar CT image reconstructions and MIPs  were obtained to evaluate the vascular anatomy. CONTRAST:  15mL OMNIPAQUE IOHEXOL 350 MG/ML SOLN COMPARISON:  Portable chest earlier today. CT Abdomen and Pelvis 12/05/2014. FINDINGS: Cardiovascular: Adequate contrast bolus timing in the pulmonary arterial tree. Mild respiratory motion. No focal filling defect identified in the pulmonary arteries to suggest acute pulmonary embolism. Cardiomegaly with no pericardial effusion. Negative visible aorta aside from mild calcified atherosclerosis. Mediastinum/Nodes: Negative. Lungs/Pleura: Low lung volumes, lower than in 2016. Crowding of bronchovascular structures bilaterally. Questionable mild septal thickening. No pleural effusion or consolidation. There is a 6 millimeter right lower lobe lung nodule on image 87 of series 6. There is mild thickening along the right minor fissure. Upper Abdomen: Streak artifact in the upper abdomen. There is a small volume of ascites in the upper abdomen which is new since 2016. Hepatic cirrhosis demonstrated on the prior CT. No focal abnormality of the visible liver, spleen, pancreas, adrenal glands or kidneys. Poor definition of the gallbladder. Mild wall thickening of decompressed transverse colon. Musculoskeletal: No acute osseous abnormality identified. Review of the MIP images confirms the above findings. IMPRESSION: 1. No acute pulmonary embolus identified. 2. Low lung volumes. Questionable pulmonary septal thickening. Consider mild interstitial edema. 3. New small volume ascites in the upper abdomen since 2016, most likely related to progressed cirrhosis given findings on the prior CT Abdomen and Pelvis. Electronically Signed   By: Odessa Fleming M.D.   On: 04/04/2019 05:56   Dg Chest Portable 1 View  Result Date: 04/02/2019 CLINICAL DATA:  Hypotension and shoulder pain EXAM: PORTABLE CHEST 1 VIEW COMPARISON:  None. FINDINGS: The heart size and mediastinal contours are within normal limits. Both lungs are clear. The visualized  skeletal structures are unremarkable. IMPRESSION: No active disease. Electronically Signed   By: Deatra Robinson M.D.   On: 04/04/2019 03:58    Pending Labs [employer] (From admission, onward)    Start  Ordered   June 24, 2019 0329  Urinalysis, Routine w reflex microscopic  Once,   R     June 24, 2019 0328          Vitals/Pain Today's Vitals   June 24, 2019 0324 June 24, 2019 0400 June 24, 2019 0446 June 24, 2019 0500  BP: (!) 82/58 (!) 78/51 (!) 82/55 (!) 82/54  Pulse:  (!) 112 (!) 101 (!) 117  Resp: (!) 22 (!) 27 (!) 21 (!) 22  Temp:      TempSrc:      SpO2:  98% 99% 94%  PainSc:        Isolation Precautions Droplet and Contact precautions  Medications Medications  0.9 %  sodium chloride infusion (has no administration in time range)  sodium chloride 0.9 % bolus 500 mL (0 mLs Intravenous Stopped 04-07-2019 0627)  iohexol (OMNIPAQUE) 350 MG/ML injection 80 mL (80 mLs Intravenous Contrast Given 04-07-2019 0534)    Mobility walks Low fall risk   Focused Assessments Hypotensive, Renal Failluire   R Recommendations: See Admitting Provider Note  Report given to:   Additional Notes: N/A

## 2019-03-30 NOTE — ED Notes (Signed)
Cbg of 44, pt given two OJ's

## 2019-03-30 NOTE — ED Notes (Signed)
Called bed placement to confirm the patient is going to 2C.

## 2019-03-30 NOTE — ED Notes (Signed)
ED TO INPATIENT HANDOFF REPORT  ED Nurse Name and Phone #: [phone]  S Name/Age/Gender Randy Vaughan 58 y.o. male Room/Bed: 025C/025C  Code Status   Code Status: Full Code  Home/SNF/Other Home Patient oriented to: self, place, time and situation Is this baseline? Yes   Triage Complete: Triage complete  Chief Complaint shoulder pain  Triage Note From home with c/o of right shoulder pain X 2 days and soreness to both right and left calves.    Allergies No Known Allergies  Level of Care/Admitting Diagnosis ED Disposition    ED Disposition Condition Comment   Admit  Hospital Area: [facility] [[id number]]  Level of Care: Progressive [102]  Covid Evaluation: N/A  Diagnosis: Decompensated hepatic cirrhosis [facility]) [[id number]]  Admitting Physician: Erenest Blank  Attending Physician: Burns Spain [phone]  Estimated length of stay: past midnight tomorrow  Certification:: I certify this patient will need inpatient services for at least 2 midnights  PT Class (Do Not Modify): Inpatient [101]  PT Acc Code (Do Not Modify): Private [1]       B Medical/Surgery History Past Medical History:  Diagnosis Date  . Diabetes mellitus without complication (HCC)   . Hepatitis B, chronic (HCC)   . Hypertension    History reviewed. No pertinent surgical history.   A IV Location/Drains/Wounds Patient Lines/Drains/Airways Status   Active Line/Drains/Airways    Name:   Placement date:   Placement time:   Site:   Days:   Peripheral IV 04/11/2019 Right;Upper Forearm   03/20/2019    0336    Forearm   less than 1   Incision (Closed) 07/10/14 Abdomen Right;Lateral   07/10/14    1024     1724          Intake/Output Last 24 hours No intake or output data in the 24 hours ending 04/12/2019 1311  Labs/Imaging Results for orders placed or performed during the hospital encounter of 03/24/2019 (from the past 48 hour(s))  CBC with Differential/Platelet     Status:  Abnormal   Collection Time: 04/15/2019  3:23 AM  Result Value Ref Range   WBC 3.0 (L) 4.0 - 10.5 K/uL   RBC 3.70 (L) 4.22 - 5.81 MIL/uL   Hemoglobin 9.3 (L) 13.0 - 17.0 g/dL   HCT 78.2 (L) 95.6 - 21.3 %   MCV 74.9 (L) 80.0 - 100.0 fL   MCH 25.1 (L) 26.0 - 34.0 pg   MCHC 33.6 30.0 - 36.0 g/dL   RDW 08.6 (H) 57.8 - 46.9 %   Platelets 54 (L) 150 - 400 K/uL    Comment: REPEATED TO VERIFY PLATELET COUNT CONFIRMED BY SMEAR SPECIMEN CHECKED FOR CLOTS Immature Platelet Fraction may be clinically indicated, consider ordering this additional test [id number]    nRBC 0.0 0.0 - 0.2 %   Neutrophils Relative % 46 %   Neutro Abs 1.4 (L) 1.7 - 7.7 K/uL   Lymphocytes Relative 8 %   Lymphs Abs 0.3 (L) 0.7 - 4.0 K/uL   Monocytes Relative 7 %   Monocytes Absolute 0.2 0.1 - 1.0 K/uL   Eosinophils Relative 27 %   Eosinophils Absolute 0.8 (H) 0.0 - 0.5 K/uL   Basophils Relative 1 %   Basophils Absolute 0.0 0.0 - 0.1 K/uL   WBC Morphology MILD LEFT SHIFT (1-5% METAS, OCC MYELO, OCC BANDS)    Immature Granulocytes 11 %   Abs Immature Granulocytes 0.32 (H) 0.00 - 0.07 K/uL   Polychromasia PRESENT  Target Cells PRESENT     Comment: Performed at [facility], [address] [place], [place] [address]  Troponin I - ONCE - STAT     Status: None   Collection Time: Jul 16, 2019  3:23 AM  Result Value Ref Range   Troponin I <0.03 <0.03 ng/mL    Comment: Performed at [facility], [address] [place], [place] [address]  Comprehensive metabolic panel     Status: Abnormal   Collection Time: Jul 16, 2019  3:23 AM  Result Value Ref Range   Sodium 131 (L) 135 - 145 mmol/L   Potassium 4.2 3.5 - 5.1 mmol/L   Chloride 102 98 - 111 mmol/L   CO2 13 (L) 22 - 32 mmol/L   Glucose, Bld 305 (H) 70 - 99 mg/dL   BUN 33 (H) 6 - 20 mg/dL   Creatinine, Ser 0.981.87 (H) 0.61 - 1.24 mg/dL   Calcium 8.1 (L) 8.9 - 10.3 mg/dL   Total Protein 6.6 6.5 - 8.1 g/dL   Albumin 1.7 (L) 3.5 - 5.0 g/dL   AST 119124 (H) 15 - 41 U/L    ALT 78 (H) 0 - 44 U/L   Alkaline Phosphatase 96 38 - 126 U/L   Total Bilirubin 7.5 (H) 0.3 - 1.2 mg/dL   GFR calc non Af Amer 38 (L) >60 mL/min   GFR calc Af Amer 45 (L) >60 mL/min   Anion gap 16 (H) 5 - 15    Comment: Performed at [facility], [address] [place], [place] [address]  CK     Status: None   Collection Time: Jul 16, 2019  3:23 AM  Result Value Ref Range   Total CK 124 49 - 397 U/L    Comment: Performed at [facility], [address] [place], [place] [address]  Protime-INR     Status: Abnormal   Collection Time: Jul 16, 2019  3:23 AM  Result Value Ref Range   Prothrombin Time 30.1 (H) 11.4 - 15.2 seconds   INR 2.9 (H) 0.8 - 1.2    Comment: (NOTE) INR goal varies based on device and disease states. Performed at [facility], [address] [place], [place] [address]   Hemoglobin A1c     Status: Abnormal   Collection Time: Jul 16, 2019  3:23 AM  Result Value Ref Range   Hgb A1c MFr Bld 8.3 (H) 4.8 - 5.6 %    Comment: (NOTE) Pre diabetes:          5.7%-6.4% Diabetes:              >6.4% Glycemic control for   <7.0% adults with diabetes    Mean Plasma Glucose 191.51 mg/dL    Comment: Performed at [facility], [address] [place], [place] [address]  Ammonia     Status: Abnormal   Collection Time: Jul 16, 2019  3:40 AM  Result Value Ref Range   Ammonia 60 (H) 9 - 35 umol/L    Comment: Performed at [facility], [address] [place], [place] [address]  SARS Coronavirus 2 [facility](Hospital order, Performed in [facility] lab)     Status: None   Collection Time: Jul 16, 2019  3:40 AM  Result Value Ref Range   SARS Coronavirus 2 NEGATIVE NEGATIVE    Comment: (NOTE) If result is NEGATIVE SARS-CoV-2 target nucleic acids are NOT DETECTED. The SARS-CoV-2 RNA is generally detectable in upper and lower  respiratory specimens during the acute  phase of infection. The lowest  concentration of SARS-CoV-2 viral copies this assay can detect is 250   copies / mL. A negative result does not preclude SARS-CoV-2 infection  and should not be used as the sole basis for treatment or other  patient management decisions.  A negative result may occur with  improper specimen collection / handling, submission of specimen other  than nasopharyngeal swab, presence of viral mutation(s) within the  areas targeted by this assay, and inadequate number of viral copies  (<250 copies / mL). A negative result must be combined with clinical  observations, patient history, and epidemiological information. If result is POSITIVE SARS-CoV-2 target nucleic acids are DETECTED. The SARS-CoV-2 RNA is generally detectable in upper and lower  respiratory specimens dur ing the acute phase of infection.  Positive  results are indicative of active infection with SARS-CoV-2.  Clinical  correlation with patient history and other diagnostic information is  necessary to determine patient infection status.  Positive results do  not rule out bacterial infection or co-infection with other viruses. If result is PRESUMPTIVE POSTIVE SARS-CoV-2 nucleic acids MAY BE PRESENT.   A presumptive positive result was obtained on the submitted specimen  and confirmed on repeat testing.  While 2019 novel coronavirus  (SARS-CoV-2) nucleic acids may be present in the submitted sample  additional confirmatory testing may be necessary for epidemiological  and / or clinical management purposes  to differentiate between  SARS-CoV-2 and other Sarbecovirus currently known to infect humans.  If clinically indicated additional testing with an alternate test  methodology [phone]) is advised. The SARS-CoV-2 RNA is generally  detectable in upper and lower respiratory sp ecimens during the acute  phase of infection. The expected result is Negative. Fact Sheet for Patients:  BoilerBrush.com.cy Fact Sheet for Healthcare  Providers: https://pope.com/ This test is not yet approved or cleared by the [place] FDA and has been authorized for detection and/or diagnosis of SARS-CoV-2 by FDA under an Emergency Use Authorization (EUA).  This EUA will remain in effect (meaning this test can be used) for the duration of the COVID-19 declaration under Section 564(b)(1) of the Act, 21 U.S.C. section 360bbb-3(b)(1), unless the authorization is terminated or revoked sooner. Performed at [facility], [address], [place], [place] [address]   Urinalysis, Routine w reflex microscopic     Status: Abnormal   Collection Time: 04/10/2019  6:30 AM  Result Value Ref Range   Color, Urine AMBER (A) YELLOW    Comment: BIOCHEMICALS MAY BE AFFECTED BY COLOR   APPearance HAZY (A) CLEAR   Specific Gravity, Urine 1.023 1.005 - 1.030   pH 5.0 5.0 - 8.0   Glucose, UA >=500 (A) NEGATIVE mg/dL   Hgb urine dipstick SMALL (A) NEGATIVE   Bilirubin Urine SMALL (A) NEGATIVE   Ketones, ur NEGATIVE NEGATIVE mg/dL   Protein, ur 30 (A) NEGATIVE mg/dL   Nitrite NEGATIVE NEGATIVE   Leukocytes,Ua NEGATIVE NEGATIVE   RBC / HPF 0-5 0 - 5 RBC/hpf   WBC, UA 11-20 0 - 5 WBC/hpf   Bacteria, UA MANY (A) NONE SEEN   Squamous Epithelial / LPF 0-5 0 - 5   Mucus PRESENT    Hyaline Casts, UA PRESENT     Comment: Performed at [facility], [address], [place], [place] [address]  CBG monitoring, ED     Status: Abnormal   Collection Time: 04/10/2019 10:12 AM  Result Value Ref Range   Glucose-Capillary 191 (H) 70 -  99 mg/dL  CBG monitoring, ED     Status: Abnormal   Collection Time: 01-Apr-2019 12:46 PM  Result Value Ref Range   Glucose-Capillary 129 (H) 70 - 99 mg/dL   Dg Shoulder Right  Result Date: 04/01/2019 CLINICAL DATA:  Pain for 2 days EXAM: RIGHT SHOULDER - 2+ VIEW COMPARISON:  None. FINDINGS: Frontal, oblique, Y scapular, and axillary images were obtained. There is no appreciable fracture or  dislocation. There is mild widening of the acromioclavicular and coracoclavicular spaces, raising concern for potential separation in these areas. There is no appreciable joint space narrowing. There is mild bony overgrowth along the lateral right clavicle. No intra-articular calcification evident. Visualized right lung clear. IMPRESSION: Question degree of acromioclavicular and coracoclavicular separation. No frank dislocation. No fracture. No appreciable joint space narrowing or erosion. Slight bony overgrowth along lateral right clavicle. Electronically Signed   By: Bretta Bang III M.D.   On: Apr 01, 2019 09:01   Ct Angio Chest Pe W And/or Wo Contrast  Result Date: 04-01-2019 CLINICAL DATA:  58 year old male with right side chest pain and shortness of breath for 2 days. Bilateral lower extremity swelling. EXAM: CT ANGIOGRAPHY CHEST WITH CONTRAST TECHNIQUE: Multidetector CT imaging of the chest was performed using the standard protocol during bolus administration of intravenous contrast. Multiplanar CT image reconstructions and MIPs were obtained to evaluate the vascular anatomy. CONTRAST:  80mL OMNIPAQUE IOHEXOL 350 MG/ML SOLN COMPARISON:  Portable chest earlier today. CT Abdomen and Pelvis 12/05/2014. FINDINGS: Cardiovascular: Adequate contrast bolus timing in the pulmonary arterial tree. Mild respiratory motion. No focal filling defect identified in the pulmonary arteries to suggest acute pulmonary embolism. Cardiomegaly with no pericardial effusion. Negative visible aorta aside from mild calcified atherosclerosis. Mediastinum/Nodes: Negative. Lungs/Pleura: Low lung volumes, lower than in 2016. Crowding of bronchovascular structures bilaterally. Questionable mild septal thickening. No pleural effusion or consolidation. There is a 6 millimeter right lower lobe lung nodule on image 87 of series 6. There is mild thickening along the right minor fissure. Upper Abdomen: Streak artifact in the upper abdomen.  There is a small volume of ascites in the upper abdomen which is new since 2016. Hepatic cirrhosis demonstrated on the prior CT. No focal abnormality of the visible liver, spleen, pancreas, adrenal glands or kidneys. Poor definition of the gallbladder. Mild wall thickening of decompressed transverse colon. Musculoskeletal: No acute osseous abnormality identified. Review of the MIP images confirms the above findings. IMPRESSION: 1. No acute pulmonary embolus identified. 2. Low lung volumes. Questionable pulmonary septal thickening. Consider mild interstitial edema. 3. New small volume ascites in the upper abdomen since 2016, most likely related to progressed cirrhosis given findings on the prior CT Abdomen and Pelvis. Electronically Signed   By: Odessa Fleming M.D.   On: 04-01-19 05:56   Dg Chest Portable 1 View  Result Date: Apr 01, 2019 CLINICAL DATA:  Hypotension and shoulder pain EXAM: PORTABLE CHEST 1 VIEW COMPARISON:  None. FINDINGS: The heart size and mediastinal contours are within normal limits. Both lungs are clear. The visualized skeletal structures are unremarkable. IMPRESSION: No active disease. Electronically Signed   By: Deatra Robinson M.D.   On: 04-01-19 03:58   Ir Abdomen US Limited  Result Date: 2019-04-01 CLINICAL DATA:  Decompensated a patent cirrhosis. Evaluate for intra-abdominal ascites and perform ultrasound-guided paracentesis as indicated. EXAM: LIMITED ABDOMEN ULTRASOUND FOR ASCITES TECHNIQUE: Limited ultrasound survey for ascites was performed in all four abdominal quadrants. COMPARISON:  Chest CT-2019/04/01 FINDINGS: Sonographic evaluation of the abdomen is negative for any significant  intra-abdominal ascites. As such, no ultrasound-guided paracentesis was attempted. IMPRESSION: No intra-abdominal ascites. Ultrasound-guided paracentesis not attempted. Electronically Signed   By: Simonne Come M.D.   On: Apr 17, 2019 12:52    Pending Labs Unresulted Labs (From admission, onward)     Start     Ordered   03/29/2019 0500  Comprehensive metabolic panel  Tomorrow morning,   R     2019/04/17 1100   03/22/2019 0500  CBC  Tomorrow morning,   R     Apr 17, 2019 1100   04/12/2019 0500  Protime-INR  Tomorrow morning,   R     2019-04-17 1100          Vitals/Pain Today's Vitals   04/17/2019 1030 04/17/2019 1045 Apr 17, 2019 1215 04/17/19 1227  BP: (!) 79/35 (!) 85/77  (!) 83/51  Pulse:  (!) 118  (!) 123  Resp: (!) 21 (!) 22  (!) 23  Temp:      TempSrc:      SpO2:  99%  99%  PainSc: 8   6      Isolation Precautions No active isolations  Medications Medications  midodrine (PROAMATINE) tablet 5 mg (has no administration in time range)  insulin aspart (novoLOG) injection 0-15 Units (2 Units Subcutaneous Given Apr 17, 2019 1256)  Tenofovir Alafenamide Fumarate TABS 25 mg (has no administration in time range)  albumin human 5 % solution 12.5 g (has no administration in time range)  sodium chloride 0.9 % bolus 500 mL (0 mLs Intravenous Stopped 2019/04/17 0627)  iohexol (OMNIPAQUE) 350 MG/ML injection 80 mL (80 mLs Intravenous Contrast Given 04/17/2019 0534)  albumin human 5 % solution 12.5 g (12.5 g Intravenous New Bag/Given 04-17-19 1027)    Mobility walks with person assist Low fall risk   Focused Assessments    R Recommendations: See Admitting Provider Note  Report given to:   Additional Notes:  Pt is to continue with albumin

## 2019-03-30 NOTE — Progress Notes (Signed)
IR requested by Dr. Rogelia Boga for possible image-guided paracentesis.  Limited abdominal ultrasound revealed no fluid that could be safely accessed with procedure today. Images sent to Dr. Grace Isaac for review. Informed patient that procedure will not occur today. Dr. Rogelia Boga made aware.  IR available in future if needed.   Waylan Boga Traycen Goyer, PA-C 2019-04-23, 11:41 AM

## 2019-03-30 NOTE — ED Notes (Signed)
MD on call notified of pt's status- requested bed to be changed to stepdown vs icu. MD states he will change order.

## 2019-03-30 NOTE — ED Triage Notes (Signed)
From home with c/o of right shoulder pain X 2 days and soreness to both right and left calves.

## 2019-03-30 NOTE — Progress Notes (Signed)
Paged by nurse for hypoglycemia.  He received 5 units of insulin earlier.  She has been treating with D50 amps, still hypoglycemic.  Pt has normal mentation can add change.  He is oriented to person place and time.  He is tachycardic, hypotensive.  He is edematous.    -Will order brief D10 infusion while insulin washes out for safety.  Will likely take longer than normal given renal dysfunction -increase frequency of CBG checks to Q2 then Q4 hours -Repeat CMP -Asked nurse to place additional IV as pt has only one currently -Will add octreotide to help support BP and treat possible hepatorenal syndrome

## 2019-03-30 NOTE — Progress Notes (Addendum)
Paged by nurse regarding atrial fibrillation. We examined Randy Vaughan at bedside due to concerns for worsening tachycardia.  Per RN's report, Randy Vaughan had an abrupt onset of worsening tachycardia up to the 160s.  Per chart review he has been tachycardic in the 110s to 120s up until around 2200 this evening.  EKG repeated x2 showed atrial fibrillation with RVR (prior EKG earlier today revealed sinus tachycardia). He does not appear to have a history of atrial fibrillation nor takes a rate controlling agent at home.  Per nursing staff he was A&Ox4 but now appears to be A&Ox1 (self).    Additionally he has had persistent hypotension since admission (currently has SBP's ranging from 70s to 90s with maps from 50s to 60s) and is on Midodrine, albumin, and octreotide for BP support.   He has also been intermittently hypoglycemic after receiving 5 units of insulin earlier today. He was placed on 2 hours of D10 with improvement of his blood sugar but again has hypoglycemia tonight since stopping the D10 (34->given D50->improved to 98.  Blood pressure (!) 90/51, pulse (!) 38, temperature 97.6 F (36.4 C), temperature source Oral, resp. rate 19, height 5\' 6"  (1.676 m), weight 92.6 kg, SpO2 100 %. General: Lethargic male lying in bed in no acute distress Neuro: A&O x1 (self) CV: Tachycardic with rates into the 150s-160s Resp: Bibasilar crackles appreciated in both lobes bilaterally, normal WOB, normal oxygen sats on room air MSK: 1+ pitting edema of the lower extremities bilaterally, no erythema or tenderness noted at either LE  Assessment/Plan: Due to patient decompensating, we consulted critical care for transfer to ICU.   1. Atrial fibrillation with RVR: Started amiodarone drip.   2. Altered Mental Status: Likely due to hepatic encephalopathy. Will continue to monitor for any new acute changes.   2. Hypotension: Likely due to his known decompensated cirrhosis vs an underlying infection. He is currently on  Midodrine 5 mg TID, octreotide 100 mcg TID, and albumin drip. Will continue holding off on IVFs as he is volume overloaded. Will monitor his blood pressure and if it worsens will consider increasing his Midodrine dose.  3. Hypoglycemia: Likely multifactorial due to earlier insulin dose and poor PO intake. Will restart his D10 drip to help maintain his blood sugars within normal range.

## 2019-03-30 NOTE — ED Provider Notes (Signed)
Signout from Dr. Daun Peacock.  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  Found to have severe metabolic derangements including signs of liver failure and kidney injury along with hypotension.  CT PE study is negative although show some ascites. Physical Exam  BP 92/60   Pulse (!) 125   Temp 98 F (36.7 C) (Oral)   Resp (!) 25   SpO2 99%   Physical Exam  ED Course/Procedures     Procedures  MDM  Plan is admission to medical team for further work-up and management.       Terrilee Files, MD 2019-04-10 [phone]

## 2019-03-30 NOTE — ED Notes (Signed)
Reached out to Dr Antony Contras via pager to ask about albumin d/c.

## 2019-03-30 NOTE — ED Notes (Signed)
Attempted report, RN is still in a code.

## 2019-03-30 NOTE — ED Notes (Signed)
Per Dr Frances Furbish, I will start d 10 as priority. I have requested albumin.

## 2019-03-30 NOTE — ED Notes (Signed)
Attempted to give report 

## 2019-03-30 NOTE — H&P (Addendum)
Date: 03/22/2019               Patient Name:  Randy Vaughan MRN: [id number]  DOB: 1961-06-11 Age / Sex: 58 y.o., male   PCP: System, Pcp Not In         Medical Service: Internal Medicine Teaching Service         Attending Physician: Dr. Bartholomew Crews, MD    First Contact: Dr. Donne Hazel  Pager: [phone]  Second Contact: Dr. 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 He returned from his last trip to [place], [place] on Monday morning. He began to notice worsening bilateral lower extremity edema. This has occurred one other time in the past year which resolved with diuretics. He does not take diuretics normally. He reports felling thirstier and has been drinking up to 4L of water a day. He feels that his mouth is dry and that he is dehydrated. Denies SOB, chest pain, and heart palpitations. The main reason for his ED visit today was his right shoulder pain. He woke from sleep with severe shoulder pain and inability to move his right shoulder. He denies any current or past injury. He has never experienced this before. ROS also negative for fever, chills, sick contacts, N/V, constipation, and diarrhea.   On arrival to the ED, patient was hypotensive with BP 85/58 and tachycardic to 125 with RR 23, and temp 69F. Lower extremity dopplers were unable to be obtained overnight but CTA chest was negative for PE. CT also noted mild interstitial edema and small volume ascites in the upper abdomen, new since prior imaging in 2016.  Labs were significant for pancytopenia (wbc 3.0, hgb 9.3, and plts 54), mild hyponatremia 131 which corrects to normal with his hyperglycemia (glucose 305), renal dysfunction with Scr 1.8 and GFR 38-45 (basline normal Scr 0.7). Albumin was low 1.7 and AST elevated 124, ALT 78, and T bili 7.5 with normal alk phos. Also with anion gap of 16. He was given a 500 cc bolus and IV albumin.   Meds:  Current Meds  Medication Sig   glipiZIDE (GLUCOTROL XL) 10 MG 24 hr tablet Take 10 mg by mouth 2 (two) times a day.   hydrochlorothiazide (HYDRODIURIL) 25 MG tablet Take 25 mg by mouth daily.   lovastatin (MEVACOR) 20 MG tablet Take 20 mg by mouth at bedtime.   metFORMIN (GLUCOPHAGE-XR) 500 MG 24 hr tablet Take 1,000 mg by mouth daily.   oxybutynin (DITROPAN-XL) 10 MG 24 hr tablet Take 10 mg by mouth daily.   VEMLIDY 25 MG TABS Take 1 tablet by mouth daily.     Allergies: Allergies as of 03/27/2019   (No Known Allergies)   Past Medical History:  Diagnosis Date   Diabetes mellitus without complication (HCC)    Hepatitis B, chronic ([place])    Hypertension     Family History: None, patient denies  Social History: Patient lives in [place] with his wife and two children. He  is a Administrator and works on the weekends. He drinks 1-2 glasses of wine a week, does not smoke, and denies illicit substance use.   Review of Systems: A complete ROS was negative except as per HPI.   Physical Exam: Blood pressure 92/60, pulse (!) 125, temperature 98 F (36.7 C), temperature source Oral, resp. rate (!) 25, SpO2 99 %. Constitutional: NAD, appears comfortable HEENT: Atraumatic, normocephalic. PERRL, anicteric sclera.  Neck: Supple, trachea midline.  Cardiovascular: Tachycardic but regular, no murmurs, rubs, or gallops.  Pulmonary/Chest: CTAB, no wheezes, rales, or rhonchi.  Abdominal: Distended but soft, hypoactive bowel sounds, non tender Extremities: Warm, significant pitting & non pitting  edema to mid shins bilaterally. Right calf tender to palpation.  Neurological: A&Ox3, CN II - XII grossly intact.  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   Decompensated Cirrhosis: Imaging with new ascites, asarcic on exam with labs significant for pancytopenia (wbc 3.0 but appears chronic, hgb 9.3, and platelets 54), creatinine 1.87, AST 124, ALT 78, T bili 7.5, and albumin 1.7. Sodium corrects to normal with hyperglycemia. Calculated MELD score is 34 and Child Pugh Class C. Follows with Dr. Benson Norway here in [place] as well as gastroenterology at [facility]. Per care everywhere patient had an EGD in 2018, but results are unavailable. Unclear trigger for decompensation. Afebrile, no signs or sxs of infection. No recent changes in medication aside from the addition of oxybutynin per urology in March. Reports he was seen by Dr. Benson Norway 2 weeks ago and doing well at that time. On exam he appears volume overloaded. Unfortunately diuresis is limited with his hypotension. Give new ascites on imaging, will obtain diagnostic paracentesis and r/o SBP. Will hold off on empiric antibiotics for now. He is not encephalopathic, no need for lactulose at this time.  -- Discussed with Dr. Benson Norway who will see patient; appreciate assistance  -- Continue tenofovir per Dr. Benson Norway  -- IV albumin 12.5 q6h x 3 -- Start midodrine 5 mg TID AC -- Diuresis once BP improved  -- U/S  guided paracentesis; f/u cell count, gram stain, culture, protein, glucose, and LDH  -- Daily labs & INR   -- Recommended alcohol cessation   AKI: Normal renal function at baseline, has never been told he has kidney disease before. Overall concerning given his clinical picture of decompensated liver failure. Etiology could be pre renal given he is intravascularly depleted vs. Hepatorenal syndrome. S/p 500 cc NS bolus in ED, albumin ordered. Will need diuresis once BP allows.  -- [place] I&Os  -- Daily labs   LE Edema: Bilateral and symmetric. Suspect third spacing due to hypoalbuminemia and liver failure, however given recent long distance travel, will obtain dopplers to rule out DVT. --F/u doppler   Right Shoulder Pain: Acute onset with decreased range of motion. Has severe pain with both passive and active ROM. No erythema or swelling to suggest septic joint. Likely adhesive capsulitis. Will obtain plain films.  -- F/u xrays  Type II DM: Hyperglycemic to 305 on admission with AG of 16. Unfortunately unable to start IV fluids with anasarca and liver failure.  -- Hold metformin -- SSI q4h   HTN: -- Holding home HCTZ and lisinopril   Urge Urinary Incontinence: Seen by urology in March and started on oxybutinin. Apparently can have side effects of increased thirst and fluid retention, recommended "use with caution" in liver failure.  -- Holding for now  FEN: No fluids, replete lytes prn, NPO pending paracentesis  VTE ppx: SCDs Code Status: FULL   Dispo: Admit patient to Inpatient with expected length of stay greater than 2 midnights.  SignedVelna Ochs, MD 03/22/2019, 8:13 AM  Pager: [phone]

## 2019-03-30 NOTE — ED Provider Notes (Signed)
[facility] EMERGENCY DEPARTMENT Provider Note   CSN: [id number] Arrival date & time: 03/19/2019  0303    History   Chief Complaint Chief Complaint  Patient presents with   Shoulder Pain    HPI Randy Vaughan is a 58 y.o. male.     The history is provided by the patient. No language interpreter was used.  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  Was seen at urgent care and told to come to the ED for PE DVT work up.    Past Medical History:  Diagnosis Date   Diabetes mellitus without complication (HCC)    Hepatitis B, chronic (HCC)    Hypertension     There are no active problems to display for this patient.   History reviewed. No pertinent surgical history.      Home Medications    Prior to Admission medications   Medication Sig Start Date End Date Taking? Authorizing Provider  hydrochlorothiazide (HYDRODIURIL) 25 MG tablet Take 25 mg by mouth daily.    [provider]  lovastatin (MEVACOR) 20 MG tablet Take 20 mg by mouth at bedtime.    [provider]    Family History History reviewed. No pertinent family history.  Social History Social History   Tobacco Use   Smoking status: Never Smoker   Smokeless tobacco: Never Used  Substance Use Topics   Alcohol use: Yes    Comment: occas   Drug use: Not Currently     Allergies   Patient has no known allergies.   Review  of Systems Review of Systems  Constitutional: Negative for diaphoresis and fever.  Respiratory: Negative for cough, wheezing and stridor.   Cardiovascular: Positive for palpitations and leg swelling.  Gastrointestinal: Negative for abdominal pain, nausea and vomiting.  Musculoskeletal: Positive for arthralgias and joint swelling. Negative for back pain.  All other systems reviewed and are negative.    Physical Exam Updated Vital Signs BP (!) 82/54    Pulse (!) 117    Temp 98 F (36.7 C) (Oral)    Resp (!) 22    SpO2 94%   Physical Exam Vitals signs and nursing note reviewed.  Constitutional:      Appearance: He is not diaphoretic.  HENT:     Head: Normocephalic and atraumatic.     Nose: Nose normal.  Eyes:     Conjunctiva/sclera: Conjunctivae normal.     Pupils: Pupils are equal, round, and reactive to light.  Neck:     Musculoskeletal: Normal range of motion and neck supple.  Cardiovascular:     Rate and Rhythm: Regular rhythm. Tachycardia present.     Pulses: Normal pulses.     Heart sounds: Normal heart sounds.  Pulmonary:     Effort: Tachypnea present.     Breath sounds: No wheezing or rales.  Abdominal:  General: There is distension.     Palpations: There is shifting dullness and fluid wave.     Tenderness: There is no rebound.  Musculoskeletal:        General: Tenderness present.     Right lower leg: Edema present.     Left lower leg: Edema present.  Skin:    General: Skin is warm and dry.     Capillary Refill: Capillary refill takes less than 2 seconds.  Neurological:     General: No focal deficit present.     Mental Status: He is alert and oriented to person, place, and time.  Psychiatric:        Mood and Affect: Mood normal.        Behavior: Behavior normal.      ED Treatments / Results  Labs (all labs ordered are listed, but only abnormal results are displayed) Results for orders placed or performed during the hospital encounter of Apr 04, 2019    SARS Coronavirus 2 [facility] order, Performed in [facility] hospital lab)  Result Value Ref Range   SARS Coronavirus 2 NEGATIVE NEGATIVE  CBC with Differential/Platelet  Result Value Ref Range   WBC 3.0 (L) 4.0 - 10.5 K/uL   RBC 3.70 (L) 4.22 - 5.81 MIL/uL   Hemoglobin 9.3 (L) 13.0 - 17.0 g/dL   HCT 03.1 (L) 28.1 - 18.8 %   MCV 74.9 (L) 80.0 - 100.0 fL   MCH 25.1 (L) 26.0 - 34.0 pg   MCHC 33.6 30.0 - 36.0 g/dL   RDW 67.7 (H) 37.3 - 66.8 %   Platelets 54 (L) 150 - 400 K/uL   nRBC 0.0 0.0 - 0.2 %   Neutrophils Relative % 46 %   Neutro Abs 1.4 (L) 1.7 - 7.7 K/uL   Lymphocytes Relative 8 %   Lymphs Abs 0.3 (L) 0.7 - 4.0 K/uL   Monocytes Relative 7 %   Monocytes Absolute 0.2 0.1 - 1.0 K/uL   Eosinophils Relative 27 %   Eosinophils Absolute 0.8 (H) 0.0 - 0.5 K/uL   Basophils Relative 1 %   Basophils Absolute 0.0 0.0 - 0.1 K/uL   WBC Morphology MILD LEFT SHIFT (1-5% METAS, OCC MYELO, OCC BANDS)    Immature Granulocytes 11 %   Abs Immature Granulocytes 0.32 (H) 0.00 - 0.07 K/uL   Polychromasia PRESENT    Target Cells PRESENT   Troponin I - ONCE - STAT  Result Value Ref Range   Troponin I <0.03 <0.03 ng/mL  Comprehensive metabolic panel  Result Value Ref Range   Sodium 131 (L) 135 - 145 mmol/L   Potassium 4.2 3.5 - 5.1 mmol/L   Chloride 102 98 - 111 mmol/L   CO2 13 (L) 22 - 32 mmol/L   Glucose, Bld 305 (H) 70 - 99 mg/dL   BUN 33 (H) 6 - 20 mg/dL   Creatinine, Ser 1.59 (H) 0.61 - 1.24 mg/dL   Calcium 8.1 (L) 8.9 - 10.3 mg/dL   Total Protein 6.6 6.5 - 8.1 g/dL   Albumin 1.7 (L) 3.5 - 5.0 g/dL   AST 470 (H) 15 - 41 U/L   ALT 78 (H) 0 - 44 U/L   Alkaline Phosphatase 96 38 - 126 U/L   Total Bilirubin 7.5 (H) 0.3 - 1.2 mg/dL   GFR calc non Af Amer 38 (L) >60 mL/min   GFR calc Af Amer 45 (L) >60 mL/min   Anion gap 16 (H) 5 - 15  Ammonia  Result Value Ref Range  Ammonia 60 (H) 9 - 35 umol/L  CK  Result Value Ref Range   Total CK 124 49 - 397 U/L  Protime-INR  Result  Value Ref Range   Prothrombin Time 30.1 (H) 11.4 - 15.2 seconds   INR 2.9 (H) 0.8 - 1.2   Ct Angio Chest Pe W And/or Wo Contrast  Result Date: 04/07/2019 CLINICAL DATA:  58 year old male with right side chest pain and shortness of breath for 2 days. Bilateral lower extremity swelling. EXAM: CT ANGIOGRAPHY CHEST WITH CONTRAST TECHNIQUE: Multidetector CT imaging of the chest was performed using the standard protocol during bolus administration of intravenous contrast. Multiplanar CT image reconstructions and MIPs were obtained to evaluate the vascular anatomy. CONTRAST:  80mL OMNIPAQUE IOHEXOL 350 MG/ML SOLN COMPARISON:  Portable chest earlier today. CT Abdomen and Pelvis 12/05/2014. FINDINGS: Cardiovascular: Adequate contrast bolus timing in the pulmonary arterial tree. Mild respiratory motion. No focal filling defect identified in the pulmonary arteries to suggest acute pulmonary embolism. Cardiomegaly with no pericardial effusion. Negative visible aorta aside from mild calcified atherosclerosis. Mediastinum/Nodes: Negative. Lungs/Pleura: Low lung volumes, lower than in 2016. Crowding of bronchovascular structures bilaterally. Questionable mild septal thickening. No pleural effusion or consolidation. There is a 6 millimeter right lower lobe lung nodule on image 87 of series 6. There is mild thickening along the right minor fissure. Upper Abdomen: Streak artifact in the upper abdomen. There is a small volume of ascites in the upper abdomen which is new since 2016. Hepatic cirrhosis demonstrated on the prior CT. No focal abnormality of the visible liver, spleen, pancreas, adrenal glands or kidneys. Poor definition of the gallbladder. Mild wall thickening of decompressed transverse colon. Musculoskeletal: No acute osseous abnormality identified. Review of the MIP images confirms the above findings. IMPRESSION: 1. No acute pulmonary embolus identified. 2. Low lung volumes. Questionable pulmonary septal thickening.  Consider mild interstitial edema. 3. New small volume ascites in the upper abdomen since 2016, most likely related to progressed cirrhosis given findings on the prior CT Abdomen and Pelvis. Electronically Signed   By: Odessa FlemingH  Hall M.D.   On: 03/19/2019 05:56   Dg Chest Portable 1 View  Result Date: 04/07/2019 CLINICAL DATA:  Hypotension and shoulder pain EXAM: PORTABLE CHEST 1 VIEW COMPARISON:  None. FINDINGS: The heart size and mediastinal contours are within normal limits. Both lungs are clear. The visualized skeletal structures are unremarkable. IMPRESSION: No active disease. Electronically Signed   By: Deatra RobinsonKevin  Herman M.D.   On: 03/28/2019 03:58    EKG EKG Interpretation  Date/Time:  Wednesday Mar 30 2019 03:17:37 EDT Ventricular Rate:  108 PR Interval:    QRS Duration: 91 QT Interval:  333 QTC Calculation: 447 R Axis:   27 Text Interpretation:  Sinus tachycardia A Confirmed by Viki Carrera ([id number]) on 04/07/2019 4:03:02 AM   Radiology Ct Angio Chest Pe W And/or Wo Contrast  Result Date: 04/06/2019 CLINICAL DATA:  58 year old male with right side chest pain and shortness of breath for 2 days. Bilateral lower extremity swelling. EXAM: CT ANGIOGRAPHY CHEST WITH CONTRAST TECHNIQUE: Multidetector CT imaging of the chest was performed using the standard protocol during bolus administration of intravenous contrast. Multiplanar CT image reconstructions and MIPs were obtained to evaluate the vascular anatomy. CONTRAST:  80mL OMNIPAQUE IOHEXOL 350 MG/ML SOLN COMPARISON:  Portable chest earlier today. CT Abdomen and Pelvis 12/05/2014. FINDINGS: Cardiovascular: Adequate contrast bolus timing in the pulmonary arterial tree. Mild respiratory motion. No focal filling defect identified in the pulmonary arteries to suggest acute  pulmonary embolism. Cardiomegaly with no pericardial effusion. Negative visible aorta aside from mild calcified atherosclerosis. Mediastinum/Nodes: Negative. Lungs/Pleura: Low lung  volumes, lower than in 2016. Crowding of bronchovascular structures bilaterally. Questionable mild septal thickening. No pleural effusion or consolidation. There is a 6 millimeter right lower lobe lung nodule on image 87 of series 6. There is mild thickening along the right minor fissure. Upper Abdomen: Streak artifact in the upper abdomen. There is a small volume of ascites in the upper abdomen which is new since 2016. Hepatic cirrhosis demonstrated on the prior CT. No focal abnormality of the visible liver, spleen, pancreas, adrenal glands or kidneys. Poor definition of the gallbladder. Mild wall thickening of decompressed transverse colon. Musculoskeletal: No acute osseous abnormality identified. Review of the MIP images confirms the above findings. IMPRESSION: 1. No acute pulmonary embolus identified. 2. Low lung volumes. Questionable pulmonary septal thickening. Consider mild interstitial edema. 3. New small volume ascites in the upper abdomen since 2016, most likely related to progressed cirrhosis given findings on the prior CT Abdomen and Pelvis. Electronically Signed   By: Odessa Fleming M.D.   On: 04/06/2019 05:56   Dg Chest Portable 1 View  Result Date: 04/11/2019 CLINICAL DATA:  Hypotension and shoulder pain EXAM: PORTABLE CHEST 1 VIEW COMPARISON:  None. FINDINGS: The heart size and mediastinal contours are within normal limits. Both lungs are clear. The visualized skeletal structures are unremarkable. IMPRESSION: No active disease. Electronically Signed   By: Deatra Robinson M.D.   On: 03/28/2019 03:58    Procedures Procedures (including critical care time)  Medications Ordered in ED Medications  0.9 %  sodium chloride infusion (has no administration in time range)  sodium chloride 0.9 % bolus 500 mL (0 mLs Intravenous Stopped 04/15/2019 0627)  iohexol (OMNIPAQUE) 350 MG/ML injection 80 mL (80 mLs Intravenous Contrast Given 04/16/2019 0534)      Final Clinical Impressions(s) / ED Diagnoses    Patient has anasarca secondary to liver failure but is intravascularly volume depleted.  Has renal insufficiency and elevated anion gap.  New ascites and hyperammonemia with low BP on HCTZ for BP.  He will need admission for stabilization and dopplers of BLE   Corlis Angelica, MD 03/18/2019 [phone]

## 2019-03-30 NOTE — ED Notes (Signed)
ED charge RN, has called again about a bed for this pt.

## 2019-03-30 NOTE — ED Notes (Signed)
At the time pt was about to be transferred upstairs a fresh skin tear was noted on his right lower leg. Dr. Criss Alvine was called to look at the wound. The wound was cleaned and a dressing was applied.

## 2019-03-30 NOTE — ED Provider Notes (Signed)
8:07 PM I was asked to see patient because his right calf was inadvertently hit when trying to move his stretcher.  He has a skin tear to the posterior calf.  No bony tenderness.  There is no repairable laceration but rather loss of skin.  Will need wound dressing.   Pricilla Loveless, MD 04/28/19 2008

## 2019-03-30 NOTE — ED Notes (Signed)
Patient transported to CT 

## 2019-03-30 NOTE — ED Notes (Signed)
Lanora Manis- RN & Brandon-PA notified of CBG of 37.  Pt provided 2 more OJ and is still eating graham crackers

## 2019-03-30 NOTE — ED Notes (Signed)
RN-Elizabeth notified that CBG is 44

## 2019-03-30 NOTE — ED Notes (Signed)
Daughter, mondo arakaki, would like an update when possible at [phone]

## 2019-03-30 NOTE — ED Notes (Signed)
Pt transported to IR 

## 2019-03-30 NOTE — ED Notes (Signed)
Messaged Dr. Delma Post for low cbg, got D 50 order from EDP, and spoke with Dr Frances Furbish. Pt given D50. Expressed concern that pt still does not have a bed.

## 2019-03-30 NOTE — ED Notes (Signed)
Randy Vaughan verified compatibility of albumin and 10% dextrose

## 2019-03-30 NOTE — Consult Note (Signed)
Reason for Consult: Decompensated cirrhosis Referring Physician: Teaching Service  Randy Vaughan HPI: This is a 58 year old male with a PMH of HBV cirrhosis, HDV coinfection, HTN, and DM admitted for decompensated cirrhosis.  He reports that his symptoms markedly worsened this past Friday with worsening abdominal distension and LE edema.  The patient was suffering with right shoulder pain this past Monday and he sought care.  It is not clear, but he states that he was told that he was hypotensive and he should report to the ER.  He was last seen in the office on 03/07/2019 as a follow up for his abdominal pain complaints.  Earlier in the year when he was being evaluated for abdominal pain his abdominal ultrasound on 01/10/2019 was negative for ascites and his cirrhosis appeared to be stable.  Currently, he feels that his pain is the same as before, but worse with the ascites.  The CT scan today was positive for some mild ascites in the upper abdomen from the CT of the chest.  In the past he was sent to [facility] to assist in treatment for his HBV/HDV and the plan was to continue him on Vemlidy.  He had issues with taking the medication on a routine basis and he was lost to follow up from the office for 3 years, from 2016-2019.  Past Medical History:  Diagnosis Date  . Diabetes mellitus without complication (HCC)   . Hepatitis B, chronic (HCC)   . Hypertension     History reviewed. No pertinent surgical history.  History reviewed. No pertinent family history.  Social History:  reports that he has never smoked. He has never used smokeless tobacco. He reports current alcohol use. He reports previous drug use.  Allergies: No Known Allergies  Medications:  Scheduled: . insulin aspart  0-15 Units Subcutaneous Q4H  . midodrine  5 mg Oral TID WC  . Tenofovir Alafenamide Fumarate  1 tablet Oral Daily   Continuous: . albumin human      Results for orders placed or performed during the hospital encounter  of 2019/04/03 (from the past 24 hour(s))  CBC with Differential/Platelet     Status: Abnormal   Collection Time: 04/03/2019  3:23 AM  Result Value Ref Range   WBC 3.0 (L) 4.0 - 10.5 K/uL   RBC 3.70 (L) 4.22 - 5.81 MIL/uL   Hemoglobin 9.3 (L) 13.0 - 17.0 g/dL   HCT 76.7 (L) 20.9 - 47.0 %   MCV 74.9 (L) 80.0 - 100.0 fL   MCH 25.1 (L) 26.0 - 34.0 pg   MCHC 33.6 30.0 - 36.0 g/dL   RDW 96.2 (H) 83.6 - 62.9 %   Platelets 54 (L) 150 - 400 K/uL   nRBC 0.0 0.0 - 0.2 %   Neutrophils Relative % 46 %   Neutro Abs 1.4 (L) 1.7 - 7.7 K/uL   Lymphocytes Relative 8 %   Lymphs Abs 0.3 (L) 0.7 - 4.0 K/uL   Monocytes Relative 7 %   Monocytes Absolute 0.2 0.1 - 1.0 K/uL   Eosinophils Relative 27 %   Eosinophils Absolute 0.8 (H) 0.0 - 0.5 K/uL   Basophils Relative 1 %   Basophils Absolute 0.0 0.0 - 0.1 K/uL   WBC Morphology MILD LEFT SHIFT (1-5% METAS, OCC MYELO, OCC BANDS)    Immature Granulocytes 11 %   Abs Immature Granulocytes 0.32 (H) 0.00 - 0.07 K/uL   Polychromasia PRESENT    Target Cells PRESENT   Troponin I - ONCE -  STAT     Status: None   Collection Time: 03/25/2019  3:23 AM  Result Value Ref Range   Troponin I <0.03 <0.03 ng/mL  Comprehensive metabolic panel     Status: Abnormal   Collection Time: 03/21/2019  3:23 AM  Result Value Ref Range   Sodium 131 (L) 135 - 145 mmol/L   Potassium 4.2 3.5 - 5.1 mmol/L   Chloride 102 98 - 111 mmol/L   CO2 13 (L) 22 - 32 mmol/L   Glucose, Bld 305 (H) 70 - 99 mg/dL   BUN 33 (H) 6 - 20 mg/dL   Creatinine, Ser 1.611.87 (H) 0.61 - 1.24 mg/dL   Calcium 8.1 (L) 8.9 - 10.3 mg/dL   Total Protein 6.6 6.5 - 8.1 g/dL   Albumin 1.7 (L) 3.5 - 5.0 g/dL   AST 096124 (H) 15 - 41 U/L   ALT 78 (H) 0 - 44 U/L   Alkaline Phosphatase 96 38 - 126 U/L   Total Bilirubin 7.5 (H) 0.3 - 1.2 mg/dL   GFR calc non Af Amer 38 (L) >60 mL/min   GFR calc Af Amer 45 (L) >60 mL/min   Anion gap 16 (H) 5 - 15  CK     Status: None   Collection Time: 03/18/2019  3:23 AM  Result Value Ref  Range   Total CK 124 49 - 397 U/L  Protime-INR     Status: Abnormal   Collection Time: 03/22/2019  3:23 AM  Result Value Ref Range   Prothrombin Time 30.1 (H) 11.4 - 15.2 seconds   INR 2.9 (H) 0.8 - 1.2  Hemoglobin A1c     Status: Abnormal   Collection Time: 04/03/2019  3:23 AM  Result Value Ref Range   Hgb A1c MFr Bld 8.3 (H) 4.8 - 5.6 %   Mean Plasma Glucose 191.51 mg/dL  Ammonia     Status: Abnormal   Collection Time: 03/29/2019  3:40 AM  Result Value Ref Range   Ammonia 60 (H) 9 - 35 umol/L  SARS Coronavirus 2 [facility](Hospital order, [facility])     Status: None   Collection Time: 04/09/2019  3:40 AM  Result Value Ref Range   SARS Coronavirus 2 NEGATIVE NEGATIVE  Urinalysis, Routine w reflex microscopic     Status: Abnormal   Collection Time: 03/19/2019  6:30 AM  Result Value Ref Range   Color, Urine AMBER (A) YELLOW   APPearance HAZY (A) CLEAR   Specific Gravity, Urine 1.023 1.005 - 1.030   pH 5.0 5.0 - 8.0   Glucose, UA >=500 (A) NEGATIVE mg/dL   Hgb urine dipstick SMALL (A) NEGATIVE   Bilirubin Urine SMALL (A) NEGATIVE   Ketones, ur NEGATIVE NEGATIVE mg/dL   Protein, ur 30 (A) NEGATIVE mg/dL   Nitrite NEGATIVE NEGATIVE   Leukocytes,Ua NEGATIVE NEGATIVE   RBC / HPF 0-5 0 - 5 RBC/hpf   WBC, UA 11-20 0 - 5 WBC/hpf   Bacteria, UA MANY (A) NONE SEEN   Squamous Epithelial / LPF 0-5 0 - 5   Mucus PRESENT    Hyaline Casts, UA PRESENT   CBG monitoring, ED     Status: Abnormal   Collection Time: 03/23/2019 10:12 AM  Result Value Ref Range   Glucose-Capillary 191 (H) 70 - 99 mg/dL  CBG monitoring, ED     Status: Abnormal   Collection Time: 03/21/2019 12:46 PM  Result Value Ref Range   Glucose-Capillary 129 (H) 70 - 99 mg/dL  CBG monitoring, ED     Status: Abnormal   Collection Time: 03/28/2019  2:11 PM  Result Value Ref Range   Glucose-Capillary 104 (H) 70 - 99 mg/dL   Comment 1 Notify RN    Comment 2 Document in Chart      Dg Shoulder Right  Result Date:  04/15/2019 CLINICAL DATA:  Pain for 2 days EXAM: RIGHT SHOULDER - 2+ VIEW COMPARISON:  None. FINDINGS: Frontal, oblique, Y scapular, and axillary images were obtained. There is no appreciable fracture or dislocation. There is mild widening of the acromioclavicular and coracoclavicular spaces, raising concern for potential separation in these areas. There is no appreciable joint space narrowing. There is mild bony overgrowth along the lateral right clavicle. No intra-articular calcification evident. Visualized right lung clear. IMPRESSION: Question degree of acromioclavicular and coracoclavicular separation. No frank dislocation. No fracture. No appreciable joint space narrowing or erosion. Slight bony overgrowth along lateral right clavicle. Electronically Signed   By: Bretta Bang III M.D.   On: 04/10/2019 09:01   Ct Angio Chest Pe W And/or Wo Contrast  Result Date: 04/08/2019 CLINICAL DATA:  58 year old male with right side chest pain and shortness of breath for 2 days. Bilateral lower extremity swelling. EXAM: CT ANGIOGRAPHY CHEST WITH CONTRAST TECHNIQUE: Multidetector CT imaging of the chest was performed using the standard protocol during bolus administration of intravenous contrast. Multiplanar CT image reconstructions and MIPs were obtained to evaluate the vascular anatomy. CONTRAST:  80mL OMNIPAQUE IOHEXOL 350 MG/ML SOLN COMPARISON:  Portable chest earlier today. CT Abdomen and Pelvis 12/05/2014. FINDINGS: Cardiovascular: Adequate contrast bolus timing in the pulmonary arterial tree. Mild respiratory motion. No focal filling defect identified in the pulmonary arteries to suggest acute pulmonary embolism. Cardiomegaly with no pericardial effusion. Negative visible aorta aside from mild calcified atherosclerosis. Mediastinum/Nodes: Negative. Lungs/Pleura: Low lung volumes, lower than in 2016. Crowding of bronchovascular structures bilaterally. Questionable mild septal thickening. No pleural effusion  or consolidation. There is a 6 millimeter right lower lobe lung nodule on image 87 of series 6. There is mild thickening along the right minor fissure. Upper Abdomen: Streak artifact in the upper abdomen. There is a small volume of ascites in the upper abdomen which is new since 2016. Hepatic cirrhosis demonstrated on the prior CT. No focal abnormality of the visible liver, spleen, pancreas, adrenal glands or kidneys. Poor definition of the gallbladder. Mild wall thickening of decompressed transverse colon. Musculoskeletal: No acute osseous abnormality identified. Review of the MIP images confirms the above findings. IMPRESSION: 1. No acute pulmonary embolus identified. 2. Low lung volumes. Questionable pulmonary septal thickening. Consider mild interstitial edema. 3. New small volume ascites in the upper abdomen since 2016, most likely related to progressed cirrhosis given findings on the prior CT Abdomen and Pelvis. Electronically Signed   By: Odessa Fleming M.D.   On: 04/16/2019 05:56   Dg Chest Portable 1 View  Result Date: 04/15/2019 CLINICAL DATA:  Hypotension and shoulder pain EXAM: PORTABLE CHEST 1 VIEW COMPARISON:  None. FINDINGS: The heart size and mediastinal contours are within normal limits. Both lungs are clear. The visualized skeletal structures are unremarkable. IMPRESSION: No active disease. Electronically Signed   By: Deatra Robinson M.D.   On: 04/02/2019 03:58   Ir Abdomen US Limited  Result Date: 03/24/2019 CLINICAL DATA:  Decompensated a patent cirrhosis. Evaluate for intra-abdominal ascites and perform ultrasound-guided paracentesis as indicated. EXAM: LIMITED ABDOMEN ULTRASOUND FOR ASCITES TECHNIQUE: Limited ultrasound survey for ascites was performed in all four abdominal quadrants.  COMPARISON:  Chest CT-04/11/2019 FINDINGS: Sonographic evaluation of the abdomen is negative for any significant intra-abdominal ascites. As such, no ultrasound-guided paracentesis was attempted. IMPRESSION: No  intra-abdominal ascites. Ultrasound-guided paracentesis not attempted. Electronically Signed   By: Simonne Come M.D.   On: 03/27/2019 12:52    ROS:  As stated above in the HPI otherwise negative.  Blood pressure (!) 91/59, pulse (!) 123, temperature 98 F (36.7 C), temperature source Oral, resp. rate (!) 23, SpO2 97 %.    PE: Gen: NAD, , but uncomfortable appearing, Alert and Oriented HEENT:  [place]/AT, EOMI Neck: Supple, no LAD Lungs: CTA Bilaterally CV: RRR without M/G/R ABM: Soft, diffuse tenderness, no rebound +BS Ext: 2+ LE edema, + asterixis  Assessment/Plan: 1) Decompensated HBV cirrhosis - MELD-Na 27 2) Hypotension. 3) Hepatic encephalopathy. 4) Abdominal pain.   The abdominal ultrasound was negative for any evidence of ascites that could be tapped.  He does demonstrate asterixis.  With the hypotension, I am concerned about an infectious etiology.  He will be started on ceftriaxone.  In February this year his MELD-Na was at 10 and now it is at 11.  This is very concerning and he will need to be watched carefully.  [facility] should be contacted again for the potential for consideration of liver transplantation.  Plan: 1) Ceftriaxone for possible sepsis. 2) Agree with the albumin infusion. 3) Xifaxan for hepatic encephalopathy.  Tarri Guilfoil D 04/02/2019, 5:03 PM

## 2019-03-30 NOTE — ED Notes (Signed)
Messaged

## 2019-03-30 NOTE — ED Notes (Signed)
Reached out via messenger to Attending provider to ask about albumin d/c.

## 2019-03-30 NOTE — ED Notes (Signed)
Dr. Antony Contras paged to [phone] Lanora Manis, RN-paged by Marylene Land

## 2019-03-31 ENCOUNTER — Inpatient Hospital Stay (HOSPITAL_COMMUNITY): Payer: BLUE CROSS/BLUE SHIELD

## 2019-03-31 DIAGNOSIS — I469 Cardiac arrest, cause unspecified: Secondary | ICD-10-CM

## 2019-03-31 DIAGNOSIS — R601 Generalized edema: Secondary | ICD-10-CM

## 2019-03-31 DIAGNOSIS — K7290 Hepatic failure, unspecified without coma: Secondary | ICD-10-CM

## 2019-03-31 DIAGNOSIS — K7460 Unspecified cirrhosis of liver: Secondary | ICD-10-CM

## 2019-03-31 DIAGNOSIS — J9601 Acute respiratory failure with hypoxia: Secondary | ICD-10-CM

## 2019-03-31 DIAGNOSIS — E7220 Disorder of urea cycle metabolism, unspecified: Secondary | ICD-10-CM

## 2019-03-31 DIAGNOSIS — R188 Other ascites: Secondary | ICD-10-CM

## 2019-03-31 LAB — POCT I-STAT 7, (LYTES, BLD GAS, ICA,H+H)
Acid-base deficit: 22 mmol/L — ABNORMAL HIGH (ref 0.0–2.0)
Acid-base deficit: 24 mmol/L — ABNORMAL HIGH (ref 0.0–2.0)
Acid-base deficit: 21 mmol/L — ABNORMAL HIGH (ref 0.0–2.0)
Bicarbonate: 9.4 mmol/L — ABNORMAL LOW (ref 20.0–28.0)
Bicarbonate: 7.2 mmol/L — ABNORMAL LOW (ref 20.0–28.0)
Bicarbonate: 7.9 mmol/L — ABNORMAL LOW (ref 20.0–28.0)
Calcium, Ion: 1.08 mmol/L — ABNORMAL LOW (ref 1.15–1.40)
Calcium, Ion: 1.04 mmol/L — ABNORMAL LOW (ref 1.15–1.40)
Calcium, Ion: 1 mmol/L — ABNORMAL LOW (ref 1.15–1.40)
HCT: 25 % — ABNORMAL LOW (ref 39.0–52.0)
HCT: 26 % — ABNORMAL LOW (ref 39.0–52.0)
HCT: 20 % — ABNORMAL LOW (ref 39.0–52.0)
Hemoglobin: 8.5 g/dL — ABNORMAL LOW (ref 13.0–17.0)
Hemoglobin: 8.8 g/dL — ABNORMAL LOW (ref 13.0–17.0)
Hemoglobin: 6.8 g/dL — CL (ref 13.0–17.0)
O2 Saturation: 100 %
O2 Saturation: 94 %
O2 Saturation: 90 %
Potassium: 6.5 mmol/L (ref 3.5–5.1)
Potassium: 6.7 mmol/L (ref 3.5–5.1)
Potassium: 6.8 mmol/L (ref 3.5–5.1)
Sodium: 133 mmol/L — ABNORMAL LOW (ref 135–145)
Sodium: 132 mmol/L — ABNORMAL LOW (ref 135–145)
Sodium: 134 mmol/L — ABNORMAL LOW (ref 135–145)
TCO2: 11 mmol/L — ABNORMAL LOW (ref 22–32)
TCO2: 8 mmol/L — ABNORMAL LOW (ref 22–32)
TCO2: 9 mmol/L — ABNORMAL LOW (ref 22–32)
pCO2 arterial: 44.3 mmHg (ref 32.0–48.0)
pCO2 arterial: 33.7 mmHg (ref 32.0–48.0)
pCO2 arterial: 29.9 mmHg — ABNORMAL LOW (ref 32.0–48.0)
pH, Arterial: 6.935 — CL (ref 7.350–7.450)
pH, Arterial: 6.937 — CL (ref 7.350–7.450)
pH, Arterial: 7.031 — CL (ref 7.350–7.450)
pO2, Arterial: 503 mmHg — ABNORMAL HIGH (ref 83.0–108.0)
pO2, Arterial: 110 mmHg — ABNORMAL HIGH (ref 83.0–108.0)
pO2, Arterial: 83 mmHg (ref 83.0–108.0)

## 2019-03-31 LAB — BLOOD CULTURE ID PANEL (REFLEXED)

## 2019-03-31 LAB — BASIC METABOLIC PANEL
Anion gap: 28 — ABNORMAL HIGH (ref 5–15)
BUN: 43 mg/dL — ABNORMAL HIGH (ref 6–20)
CO2: 8 mmol/L — ABNORMAL LOW (ref 22–32)
Calcium: 7.5 mg/dL — ABNORMAL LOW (ref 8.9–10.3)
Chloride: 100 mmol/L (ref 98–111)
Creatinine, Ser: 3.27 mg/dL — ABNORMAL HIGH (ref 0.61–1.24)
GFR calc Af Amer: 23 mL/min — ABNORMAL LOW (ref >60)
GFR calc non Af Amer: 20 mL/min — ABNORMAL LOW (ref >60)
Glucose, Bld: 126 mg/dL — ABNORMAL HIGH (ref 70–99)
Potassium: 7.2 mmol/L (ref 3.5–5.1)
Sodium: 136 mmol/L (ref 135–145)

## 2019-03-31 LAB — COMPREHENSIVE METABOLIC PANEL
ALT: 62 U/L — ABNORMAL HIGH (ref 0–44)
AST: 135 U/L — ABNORMAL HIGH (ref 15–41)
Albumin: 1.5 g/dL — ABNORMAL LOW (ref 3.5–5.0)
Alkaline Phosphatase: 66 U/L (ref 38–126)
Anion gap: 21 — ABNORMAL HIGH (ref 5–15)
BUN: 43 mg/dL — ABNORMAL HIGH (ref 6–20)
CO2: 7 mmol/L — ABNORMAL LOW (ref 22–32)
Calcium: 7.9 mg/dL — ABNORMAL LOW (ref 8.9–10.3)
Chloride: 105 mmol/L (ref 98–111)
Creatinine, Ser: 3.28 mg/dL — ABNORMAL HIGH (ref 0.61–1.24)
GFR calc Af Amer: 23 mL/min — ABNORMAL LOW (ref >60)
GFR calc non Af Amer: 20 mL/min — ABNORMAL LOW (ref >60)
Glucose, Bld: 179 mg/dL — ABNORMAL HIGH (ref 70–99)
Potassium: 6.7 mmol/L (ref 3.5–5.1)
Sodium: 133 mmol/L — ABNORMAL LOW (ref 135–145)
Total Bilirubin: 5.7 mg/dL — ABNORMAL HIGH (ref 0.3–1.2)
Total Protein: 5.2 g/dL — ABNORMAL LOW (ref 6.5–8.1)

## 2019-03-31 LAB — GLUCOSE, CAPILLARY
Glucose-Capillary: 155 mg/dL — ABNORMAL HIGH (ref 70–99)
Glucose-Capillary: 43 mg/dL — CL (ref 70–99)
Glucose-Capillary: 122 mg/dL — ABNORMAL HIGH (ref 70–99)
Glucose-Capillary: 307 mg/dL — ABNORMAL HIGH (ref 70–99)
Glucose-Capillary: 159 mg/dL — ABNORMAL HIGH (ref 70–99)
Glucose-Capillary: 165 mg/dL — ABNORMAL HIGH (ref 70–99)

## 2019-03-31 LAB — POTASSIUM: Potassium: 7 mmol/L (ref 3.5–5.1)

## 2019-03-31 LAB — PROTIME-INR
INR: 6.8 (ref 0.8–1.2)
Prothrombin Time: 58 seconds — ABNORMAL HIGH (ref 11.4–15.2)

## 2019-03-31 LAB — COOXEMETRY PANEL
Carboxyhemoglobin: 1.2 % (ref 0.5–1.5)
Methemoglobin: 2 % — ABNORMAL HIGH (ref 0.0–1.5)
O2 Saturation: 92.2 %
Total hemoglobin: 7.8 g/dL — ABNORMAL LOW (ref 12.0–16.0)

## 2019-03-31 LAB — LACTIC ACID, PLASMA: Lactic Acid, Venous: >11 mmol/L (ref 0.5–1.9)

## 2019-03-31 LAB — CORTISOL: Cortisol, Plasma: >100 ug/dL

## 2019-03-31 LAB — ECHOCARDIOGRAM COMPLETE
Height: 66 in
Weight: 3336.88 oz

## 2019-03-31 LAB — PREPARE RBC (CROSSMATCH)

## 2019-03-31 LAB — ABO/RH: ABO/RH(D): A POS

## 2019-03-31 MED ORDER — CHLORHEXIDINE GLUCONATE CLOTH 2 % EX PADS: 6.0000 | MEDICATED_PAD | Freq: Every day | CUTANEOUS | Status: DC

## 2019-03-31 MED ORDER — SODIUM BICARBONATE 8.4 % IV SOLN
50.0000 meq | Freq: Once | INTRAVENOUS | Status: AC
  Administered 2019-03-31: 50 meq via INTRAVENOUS

## 2019-03-31 MED ORDER — MIDAZOLAM HCL 2 MG/2ML IJ SOLN: 2.0000 mg | INTRAMUSCULAR | Status: DC | PRN

## 2019-03-31 MED ORDER — FENTANYL CITRATE (PF) 100 MCG/2ML IJ SOLN
INTRAMUSCULAR | Status: AC
  Administered 2019-03-31
  Filled 2019-03-31: qty 2

## 2019-03-31 MED ORDER — DEXTROSE 50 % IV SOLN
50.0000 mL | Freq: Once | INTRAVENOUS | Status: AC
  Administered 2019-03-31: 50 mL via INTRAVENOUS

## 2019-03-31 MED ORDER — VANCOMYCIN HCL 10 G IV SOLR
2000.0000 mg | Freq: Once | INTRAVENOUS | Status: AC
  Administered 2019-03-31: 2000 mg via INTRAVENOUS
  Filled 2019-03-31: qty 2000

## 2019-03-31 MED ORDER — DEXTROSE 10 % IV SOLN
INTRAVENOUS | Status: DC
  Administered 2019-03-31: 02:00:00 via INTRAVENOUS

## 2019-03-31 MED ORDER — VITAMIN K1 10 MG/ML IJ SOLN
10.0000 mg | Freq: Every day | INTRAMUSCULAR | Status: DC
  Administered 2019-03-31: 10 mg via SUBCUTANEOUS
  Filled 2019-03-31: qty 1

## 2019-03-31 MED ORDER — ALBUMIN HUMAN 25 % IV SOLN
50.0000 g | Freq: Once | INTRAVENOUS | Status: AC
  Administered 2019-03-31: 50 g via INTRAVENOUS
  Filled 2019-03-31: qty 200
  Filled 2019-03-31: qty 100

## 2019-03-31 MED ORDER — SODIUM POLYSTYRENE SULFONATE 15 GM/60ML PO SUSP
60.0000 g | ORAL | Status: DC
  Filled 2019-03-31: qty 240

## 2019-03-31 MED ORDER — DEXTROSE 50 % IV SOLN
50.0000 mL | Freq: Once | INTRAVENOUS | Status: AC
  Administered 2019-03-31: 50 mL via INTRAVENOUS
  Filled 2019-03-31: qty 50

## 2019-03-31 MED ORDER — PHENYLEPHRINE HCL-NACL 40-0.9 MG/250ML-% IV SOLN
0.0000 ug/min | INTRAVENOUS | Status: DC
  Administered 2019-03-31: 350 ug/min via INTRAVENOUS
  Administered 2019-03-31: 375 ug/min via INTRAVENOUS
  Administered 2019-03-31: 360 ug/min via INTRAVENOUS
  Administered 2019-03-31 (×2): 400 ug/min via INTRAVENOUS
  Filled 2019-03-31 (×6): qty 250

## 2019-03-31 MED ORDER — SODIUM BICARBONATE 8.4 % IV SOLN
100.0000 meq | Freq: Once | INTRAVENOUS | Status: AC
  Administered 2019-03-31: 100 meq via INTRAVENOUS
  Filled 2019-03-31: qty 100

## 2019-03-31 MED ORDER — SODIUM BICARBONATE 8.4 % IV SOLN
INTRAVENOUS | Status: DC
  Administered 2019-03-31 (×2): via INTRAVENOUS
  Filled 2019-03-31 (×2): qty 150

## 2019-03-31 MED ORDER — SODIUM BICARBONATE 8.4 % IV SOLN
INTRAVENOUS | Status: AC
  Administered 2019-03-31: 50 meq
  Filled 2019-03-31: qty 50

## 2019-03-31 MED ORDER — SODIUM CHLORIDE 0.9% IV SOLUTION
Freq: Once | INTRAVENOUS | Status: AC
  Administered 2019-03-31: 07:00:00 via INTRAVENOUS

## 2019-03-31 MED ORDER — MIDAZOLAM HCL 2 MG/2ML IJ SOLN
INTRAMUSCULAR | Status: AC
  Filled 2019-03-31: qty 2

## 2019-03-31 MED ORDER — HEPARIN SODIUM (PORCINE) 1000 UNIT/ML IJ SOLN
2400.0000 [IU] | Freq: Once | INTRAMUSCULAR | Status: AC
  Administered 2019-03-31: 2400 [IU] via INTRAVENOUS
  Filled 2019-03-31: qty 3

## 2019-03-31 MED ORDER — PHENYLEPHRINE HCL-NACL 10-0.9 MG/250ML-% IV SOLN
0.0000 ug/min | INTRAVENOUS | Status: DC
  Administered 2019-03-31: 200 ug/min via INTRAVENOUS
  Filled 2019-03-31 (×2): qty 250

## 2019-03-31 MED ORDER — ALBUMIN HUMAN 5 % IV SOLN
INTRAVENOUS | Status: AC
  Filled 2019-03-31: qty 500

## 2019-03-31 MED ORDER — DEXTROSE 50 % IV SOLN
25.0000 g | Freq: Once | INTRAVENOUS | Status: AC
  Administered 2019-03-31: 25 g via INTRAVENOUS
  Filled 2019-03-31: qty 50

## 2019-03-31 MED ORDER — SODIUM BICARBONATE 8.4 % IV SOLN
INTRAVENOUS | Status: AC
  Filled 2019-03-31: qty 150

## 2019-03-31 MED ORDER — PHYTONADIONE 5 MG PO TABS
10.0000 mg | ORAL_TABLET | Freq: Once | ORAL | Status: AC
  Administered 2019-03-31: 10 mg via ORAL
  Filled 2019-03-31: qty 2

## 2019-03-31 MED ORDER — ORAL CARE MOUTH RINSE: 15.0000 mL | OROMUCOSAL | Status: DC

## 2019-03-31 MED ORDER — NOREPINEPHRINE 16 MG/250ML-% IV SOLN
0.0000 ug/min | INTRAVENOUS | Status: DC
  Administered 2019-03-31: 50 ug/min via INTRAVENOUS
  Administered 2019-03-31: 40 ug/min via INTRAVENOUS
  Filled 2019-03-31 (×2): qty 250

## 2019-03-31 MED ORDER — ALBUMIN HUMAN 25 % IV SOLN
25.0000 g | Freq: Once | INTRAVENOUS | Status: AC
  Administered 2019-03-31: 25 g via INTRAVENOUS
  Filled 2019-03-31: qty 100

## 2019-03-31 MED ORDER — INSULIN ASPART 100 UNIT/ML IV SOLN
5.0000 [IU] | Freq: Once | INTRAVENOUS | Status: AC
  Administered 2019-03-31: 5 [IU] via INTRAVENOUS

## 2019-03-31 MED ORDER — SODIUM BICARBONATE 8.4 % IV SOLN
INTRAVENOUS | Status: AC
  Administered 2019-03-31: 50 meq via INTRAVENOUS
  Filled 2019-03-31: qty 100

## 2019-03-31 MED ORDER — EPINEPHRINE PF 1 MG/ML IJ SOLN
0.5000 ug/min | INTRAVENOUS | Status: DC
  Administered 2019-03-31: 20 ug/min via INTRAVENOUS
  Administered 2019-03-31: 1 ug/min via INTRAVENOUS
  Filled 2019-03-31 (×3): qty 4

## 2019-03-31 MED ORDER — FENTANYL CITRATE (PF) 100 MCG/2ML IJ SOLN: 50.0000 ug | Freq: Once | INTRAMUSCULAR | Status: DC

## 2019-03-31 MED ORDER — FENTANYL BOLUS VIA INFUSION
50.0000 ug | INTRAVENOUS | Status: DC | PRN
  Filled 2019-03-31: qty 50

## 2019-03-31 MED ORDER — CALCIUM GLUCONATE-NACL 2-0.675 GM/100ML-% IV SOLN
2.0000 g | Freq: Once | INTRAVENOUS | Status: AC
  Administered 2019-03-31: 2000 mg via INTRAVENOUS
  Filled 2019-03-31: qty 100

## 2019-03-31 MED ORDER — HYDROCORTISONE NA SUCCINATE PF 100 MG IJ SOLR
100.0000 mg | Freq: Once | INTRAMUSCULAR | Status: AC
  Administered 2019-03-31: 100 mg via INTRAVENOUS
  Filled 2019-03-31: qty 2

## 2019-03-31 MED ORDER — SODIUM BICARBONATE 8.4 % IV SOLN
200.0000 meq | Freq: Once | INTRAVENOUS | Status: AC
  Administered 2019-03-31: 200 meq via INTRAVENOUS

## 2019-03-31 MED ORDER — FENTANYL 2500MCG IN NS 250ML (10MCG/ML) PREMIX INFUSION
50.0000 ug/h | INTRAVENOUS | Status: DC
  Administered 2019-03-31: 50 ug/h via INTRAVENOUS
  Filled 2019-03-31: qty 250

## 2019-03-31 MED ORDER — MIDODRINE HCL 5 MG PO TABS
5.0000 mg | ORAL_TABLET | Freq: Three times a day (TID) | ORAL | Status: DC
  Filled 2019-03-31: qty 1

## 2019-03-31 MED ORDER — SODIUM POLYSTYRENE SULFONATE 15 GM/60ML PO SUSP
30.0000 g | ORAL | Status: DC
  Administered 2019-03-31: 30 g
  Filled 2019-03-31: qty 120

## 2019-03-31 MED ORDER — CHLORHEXIDINE GLUCONATE 0.12% ORAL RINSE (MEDLINE KIT): 15.0000 mL | Freq: Two times a day (BID) | OROMUCOSAL | Status: DC

## 2019-03-31 MED ORDER — DEXTROSE 50 % IV SOLN
INTRAVENOUS | Status: AC
  Administered 2019-03-31: 50 mL via INTRAVENOUS
  Filled 2019-03-31: qty 50

## 2019-03-31 MED ORDER — HEPARIN 1000 UNIT/ML FOR PERITONEAL DIALYSIS: 2.4000 mL | INTRAMUSCULAR | Status: DC | PRN

## 2019-03-31 MED ORDER — PANTOPRAZOLE SODIUM 40 MG IV SOLR
40.0000 mg | Freq: Every day | INTRAVENOUS | Status: DC
  Administered 2019-03-31: 40 mg via INTRAVENOUS
  Filled 2019-03-31: qty 40

## 2019-03-31 MED ORDER — SODIUM CHLORIDE 0.9 % IV SOLN
1.2500 ng/kg/min | INTRAVENOUS | Status: DC
  Administered 2019-03-31: 5 ng/kg/min via INTRAVENOUS
  Filled 2019-03-31: qty 1

## 2019-03-31 MED ORDER — TENOFOVIR ALAFENAMIDE FUMARATE 25 MG PO TABS: 1.0000 | ORAL_TABLET | Freq: Every day | ORAL | Status: DC

## 2019-03-31 MED ORDER — HYDROCORTISONE NA SUCCINATE PF 100 MG IJ SOLR
50.0000 mg | Freq: Four times a day (QID) | INTRAMUSCULAR | Status: DC
  Administered 2019-03-31: 50 mg via INTRAVENOUS
  Filled 2019-03-31: qty 2

## 2019-03-31 MED ORDER — SODIUM BICARBONATE 8.4 % IV SOLN
100.0000 meq | Freq: Once | INTRAVENOUS | Status: AC
  Administered 2019-03-31 (×2): 50 meq via INTRAVENOUS
  Filled 2019-03-31: qty 100

## 2019-03-31 MED ORDER — METRONIDAZOLE IN NACL 5-0.79 MG/ML-% IV SOLN
500.0000 mg | Freq: Three times a day (TID) | INTRAVENOUS | Status: DC
  Filled 2019-03-31: qty 100

## 2019-03-31 MED ORDER — TENOFOVIR ALAFENAMIDE FUMARATE 25 MG PO TABS
25.0000 mg | ORAL_TABLET | Freq: Every day | ORAL | Status: DC
  Filled 2019-03-31: qty 1

## 2019-03-31 MED ORDER — NOREPINEPHRINE 4 MG/250ML-% IV SOLN
INTRAVENOUS | Status: AC
  Filled 2019-03-31: qty 250

## 2019-03-31 MED ORDER — ORAL CARE MOUTH RINSE
15.0000 mL | OROMUCOSAL | Status: DC
  Administered 2019-03-31: 15 mL via OROMUCOSAL

## 2019-03-31 MED ORDER — INSULIN ASPART 100 UNIT/ML IV SOLN
3.0000 [IU] | Freq: Once | INTRAVENOUS | Status: AC
  Administered 2019-03-31: 3 [IU] via INTRAVENOUS

## 2019-03-31 MED ORDER — VASOPRESSIN 20 UNIT/ML IV SOLN
0.0400 [IU]/min | INTRAVENOUS | Status: DC
  Administered 2019-03-31: 0.03 [IU]/min via INTRAVENOUS
  Filled 2019-03-31 (×4): qty 2

## 2019-03-31 MED ORDER — SODIUM CHLORIDE 0.9 % IV SOLN
2.0000 g | INTRAVENOUS | Status: DC
  Administered 2019-03-31: 2 g via INTRAVENOUS
  Filled 2019-03-31: qty 2

## 2019-03-31 MED ORDER — RIFAXIMIN 550 MG PO TABS
550.0000 mg | ORAL_TABLET | Freq: Two times a day (BID) | ORAL | Status: DC
  Filled 2019-03-31: qty 1

## 2019-03-31 MED ORDER — SODIUM CHLORIDE 0.9% IV SOLUTION
Freq: Once | INTRAVENOUS | Status: AC
  Administered 2019-03-31: 10:00:00 via INTRAVENOUS

## 2019-03-31 MED FILL — Medication: Qty: 1 | Status: AC

## 2019-04-01 LAB — BPAM RBC
Blood Product Expiration Date: 202005192359
ISSUE DATE / TIME: 202005140859
Unit Type and Rh: 6200

## 2019-04-01 LAB — PREPARE FRESH FROZEN PLASMA

## 2019-04-01 LAB — TYPE AND SCREEN
ABO/RH(D): A POS
Antibody Screen: NEGATIVE
Unit division: 0

## 2019-04-01 LAB — BPAM FFP
Blood Product Expiration Date: 202005162359
Blood Product Expiration Date: 202005162359
ISSUE DATE / TIME: 202005140538
ISSUE DATE / TIME: 202005140723
Unit Type and Rh: 600
Unit Type and Rh: 6200

## 2019-04-02 LAB — CULTURE, BLOOD (ROUTINE X 2): Special Requests: ADEQUATE

## 2019-04-04 LAB — GLUCOSE, CAPILLARY: Glucose-Capillary: 44 mg/dL — CL (ref 70–99)

## 2019-04-07 ENCOUNTER — Telehealth: Payer: Self-pay | Admitting: *Deleted

## 2019-04-07 NOTE — Telephone Encounter (Signed)
Received signed original D/C-[facility] contacted for pick up.

## 2019-04-07 NOTE — Telephone Encounter (Signed)
Received original D/C from [facility]-D/C forwarded to Dr.Alva([phone])to sign for Dr.Marshall.

## 2019-04-18 NOTE — Progress Notes (Signed)
CRITICAL VALUE ALERT  Critical Value:  K+ 7.0  Date & Time Notied:  03/18/2019 0418  Provider Notified: Pola Corn MD  Orders Received/Actions taken: see orders

## 2019-04-18 NOTE — Significant Event (Signed)
Rapid Response Event Note  Overview: Time Called: 2333 Arrival Time: 2335 Event Type: Hypotension, Cardiac  Initial Focused Assessment: Called by charge RN for pt hypotensive and new onset afib RVR per EKG. CBG 34. 98 on recheck. On arrival, pt drowsy but does arouse to voice. Able to state name only and answer yes/no questions. Per bedside RN pt was A&Ox4 on arrival from ED. BP 65/46, HR 156, spO2 93% on RA.   Primary team had just seen pt and ordered cardizem for HR. Md paged about BP concerns. D10 ordered for hypoglycemia, Amio bolus and gtt ordered. Md called and informed of pts BP remaining low and questioned starting amio gtt. Md informed ok to hold off amio until CCM assesses pt. E-Link notified of pt status, Neo and vaso ordered for BP control. On CCM arrival pt appeared to be in SVT with HR 150s. Pt cardioverted with 75J, HR 100 but continued to be hypotensive despite max pressors. CVC placed L subclavian, Intubation attempted and unsuccessful at first due to foreign object blocking airway. P dropped sats and HR 30s loss of pulse- compressions started at 0100. 1 epi given 0102, ROSC 0103.   Plan of Care (if not transferred): Pt transferred to 3M05  Event Summary: Name of Physician Notified: Chundi  at 2342  Name of Consulting Physician Notified: Ogan- CCM E-Link at 0002  Outcome: Coded and survived, Transferred (Comment)(Coded and transferred to [facility])  Event End Time: 0155  Mordecai Rasmussen

## 2019-04-18 NOTE — Progress Notes (Signed)
RR increased to 24 per MD order.

## 2019-04-18 NOTE — Progress Notes (Signed)
Numerous attempts to reach family via numbers on file without success.   Around 40 daughter called ICU and I was able to relay the pt's extremely critical state and the fact he is likely actively dying and we are unable to do dialysis on this patient 2/2 5 pressors with map in the 52's.   She became tearful and hung up phone. Unable to direct further information or goals of care.

## 2019-04-18 NOTE — Progress Notes (Signed)
eLink Physician-Brief Progress Note Patient Name: Randy Vaughan DOB: 07-03-1961 MRN: [id number]   Date of Service  04/09/2019  HPI/Events of Note  Patient needs FFP to treat coagulopathy. We are unable to reach family for consent.  eICU Interventions  I authorized transfusion of FFP under emergent need basis, also ordered Vitamin K.        Thomasene Lot [name] 03/30/2019, 5:31 AM

## 2019-04-18 NOTE — Procedures (Signed)
Hemodialysis Catheter Insertion Procedure Note Randy Vaughan [id number] 1961-02-10  Procedure: Insertion of Hemodialysis Catheter Indications: CRRT  Procedure Details Consent: Unable to obtain consent because of emergent medical necessity. Time Out: Verified patient identification, verified procedure, site/side was marked, verified correct patient position, special equipment/implants available, medications/allergies/relevent history reviewed, required imaging and test results available.  Performed  Maximum sterile technique was used including antiseptics, cap, gloves, gown, hand hygiene, mask and sheet. Skin prep: Chlorhexidine; local anesthetic administered A antimicrobial bonded/coated triple lumen catheter was placed in the right internal jugular vein using the Seldinger technique.  Evaluation Blood flow good Complications: No apparent complications Patient did tolerate procedure well. Chest X-ray ordered to verify placement.  CXR: pending.  Procedure performed under direct ultrasound guidance for real time vessel cannulation.      [name], [place] - [facility] [place] [facility] Pgr: [phone]  or [phone] Apr 11, 2019, 6:50 AM

## 2019-04-18 NOTE — Progress Notes (Signed)
Called and left message about belongings.  Will transfer belongings with body to morgue.  Erick Blinks, RN

## 2019-04-18 NOTE — Progress Notes (Signed)
Wasted 240cc of fentanyl in narcotic bin with Eddie Candle, RN.

## 2019-04-18 NOTE — Consult Note (Signed)
[name], MRN:  [id number], DOB:  [birth year], LOS: 1 ADMISSION DATE:  2019-04-18, CONSULTATION DATE:  04-19-19 REFERRING MD:  Lynnae January  CHIEF COMPLAINT:  Hypotension   Brief History   Randy Vaughan is a 58 y.o. male who was admitted 2023/04/18 with concerns for decompensated cirrhosis. Had SVT with hypotension and AMS early AM 04/19/23.  PCCM consulted.  Attempted synch cardioversion with some success but later had bradycardic arrest during intubation.  History of present illness   Pt is encephelopathic; therefore, this HPI is obtained from chart review. Randy Vaughan is a 58 y.o. 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  He presented to [facility] ED Apr 18, 2023 with shoulder pain and LE edema X 2 days.  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  Returned home from [place] on 5/11 and noticed LE edema.  Didn't think much of it as happened in the past and resolved with diuretics (which he is not on chronically).  Came to ED due to right shoulder pain.    In ED, he was found to be hypotensive with BP 85/58 with HR of 125.  He had CTA of chest which was negative for PE.  LE duplex was ordered but not yet performed.  He was found to have multiple lab abnormalities consistent with decompensated cirrhosis.  He was admitted by IMTS and was evaluated by Dr. Benson Norway of GI who recommended continuing his tenofovir and starting albumin and midodrine.  He was started on empiric ceftriaxone for mild ascites (not enough to warrant diagnostic paracentesis).  Dr. Benson Norway had recommended that [facility] be contacted again regarding the potential for consideration of liver transplant.  During early AM hours April 18, 2023, PCCM was called in consultation urgently due to HR in 160s with worsening hypotension.  He had episode of A.fib with RVR prior to our arrival.  At the time of our arrival, HR was 140's with SBP in  60's despite 265mg/min neosynephrine. We performed synchronized cardioversion with 75J.  HR improved to ~100 and SBP ranged anywhere from 60's to 80s.  Neo was increased to 4058m/min.  Due to poor IV access, I performed emergent left sided subclavian CVL due to increasing pressor needs.   Given his poor airway protection, decision was made to intubate pt.  Given hemodynamic instability, Dr. ScGilford Raidpted for awake intubation using bronchoscope.  This was attempted but significant resistance was met.  While attempting, pt became pulseless.  ACLS protocol was initiated.  He was given 7m48mpinephrine and was intubated by Dr. ScaGilford Raidfore achieving ROSC.  During intubation, partial dentures were noted down in posterior oropharynx just proximal to the vocal cords. After ROSC, he was moving purposefully and was transferred to the ICU for further evaluation and management.  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  Significant Hospital Events   5/1Jun 01, 2024admit. 5/12024/06/02coded, transferred to ICU.  Consults:  GI. PCCM.  Procedures:  ETT 5/106/02/24 L [place] CVL 5/106-02-24 Art line pending 5/106-12-2022  Significant Diagnostic Tests:  CTA chest 5/106-01-24no PE.  Small volume ascites. R shoulder Xray 5/106/01/24questionable degree of AC and CC separation.  No dislocation or fracture. Abd US [place]1Jun 01, 2024no ascites. CXR 5/1June 02, 2024 Echo 5/106/02/24  Micro Data:  Blood 5/101-Jun-2024  SARS CoV2 5/13 > neg.  Antimicrobials:  Ceftriaxone 5/13 >    Interim history/subjective:  Just intubated.  Sedated.  Comfortable.  Objective:  Blood pressure (!) 65/46, pulse 89, temperature 97.6 F (36.4 C), temperature source Oral, resp. rate (!) 23, height 5' 6"  (1.676 m), weight 92.6 kg, SpO2 100 %.        Intake/Output Summary (Last 24 hours) at Apr 06, 2019 0126 Last data filed at 03/29/2019 2012 Gross per 24 hour  Intake 338.99 ml  Output -  Net 338.99 ml   Filed Weights   03/29/2019 2051  Weight: 92.6 kg     Examination: General: Adult male, in NAD. Neuro: Sedated, not following commands. HEENT: [place]/AT. Sclerae anicteric.  ETT in place.  Poor dentition.. Cardiovascular: RRR, no M/R/G.  Lungs: Respirations even and unlabored.  CTA bilaterally, No W/R/R.  Abdomen: Abdomen distended.  No fluid on limited abd [place].  BS hypoactive. Musculoskeletal: No gross deformities, 1+ edema.  Skin: Intact, warm, no rashes.  Assessment & Plan:   Respiratory insufficiency - due to inability to protect the airway secondary to AMS in the setting of hypotension. - Full vent support. - Assess ABG. - Wean as able. - Daily SBT. - Bronchial hygiene. - Follow CXR.  Bradycardic arrest during intubation - unclear etiology.  Had SVT with hypotension for which synchronized cardioversion was performed.  Improvement in HR to low 100's.  BP now remains low, concern for shock of unclear etiology. - Continue levophed, neosynephrine and vasopressin for goal MAP > 65. - Start stress steroids, d/c if cortisol > 20. - Assess lactate, co-ox, echo, CVP's. - Repeat labs.  Decompensated cirrhosis - MELD 36, Child Pugh Class C. - GI following, appreciate the assistance. - Continue midodrine, rifaximin. - If stabilizes, consider contacting [facility] regarding consideration for transplant.  AKI. - Follow BMP.  AGMA + NAGMA. - Start HCO3.  Transaminitis. - Follow LFT's. - GI following.  Hx DM - now hypoglycemic into 30s. - Continue D10.  LE edema - likely third spacing.  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 - F/u on LE duplex.  Possible right shoulder AC / CC separation. - Follow up once over acute issues.  Best Practice:  Diet: NPO. Pain/Anxiety/Delirium protocol (if indicated): Fentanyl gtt / Midazolam PRN.  RASS goal -1. VAP protocol (if indicated): In place. DVT prophylaxis: SCD's. GI prophylaxis: PPPI. Glucose control: D10 for hypoglycemia. Mobility: Bedrest. Code Status: Full. Family  Communication: Dr. Gilford Raid attempted to call family with update but no answer. Disposition: ICU.  Labs   CBC: Recent Labs  Lab 03/26/2019 0323  WBC 3.0*  NEUTROABS 1.4*  HGB 9.3*  HCT 27.7*  MCV 74.9*  PLT 54*   Basic Metabolic Panel: Recent Labs  Lab 04/13/2019 0323 04/16/2019 1800  NA 131* 132*  K 4.2 4.2  CL 102 103  CO2 13* 11*  GLUCOSE 305* 113*  BUN 33* 42*  CREATININE 1.87* 2.72*  CALCIUM 8.1* 8.2*   GFR: Estimated Creatinine Clearance: 31.1 mL/min (A) (by C-G formula based on SCr of 2.72 mg/dL (H)). Recent Labs  Lab 04/14/2019 0323  WBC 3.0*   Liver Function Tests: Recent Labs  Lab 04/05/2019 0323 04/04/2019 1800  AST 124* 117*  ALT 78* 71*  ALKPHOS 96 70  BILITOT 7.5* 7.0*  PROT 6.6 6.2*  ALBUMIN 1.7* 1.7*   No results for input(s): LIPASE, AMYLASE in the last 168 hours. Recent Labs  Lab 04/09/2019 0340  AMMONIA 60*  ABG    Component Value Date/Time   TCO2 26 04/11/2011 0041    Coagulation Profile: Recent Labs  Lab 04/12/2019 0323  INR 2.9*   Cardiac Enzymes: Recent Labs  Lab 03/27/2019 0323  CKTOTAL 124  TROPONINI <0.03   HbA1C: Hgb A1c MFr Bld  Date/Time Value Ref Range Status  04/13/2019 03:23 AM 8.3 (H) 4.8 - 5.6 % Final    Comment:    (NOTE) Pre diabetes:          5.7%-6.4% Diabetes:              >6.4% Glycemic control for   <7.0% adults with diabetes   04/11/2011 12:40 AM  <5.7 % Final   5.3 (NOTE)                                                                       According to the ADA Clinical Practice Recommendations for 2011, when HbA1c is used as a screening test:   >=6.5%   Diagnostic of Diabetes Mellitus           (if abnormal result  is confirmed)  5.7-6.4%   Increased risk of developing Diabetes Mellitus  References:Diagnosis and Classification of Diabetes Mellitus,Diabetes FXTK,2409,73(ZHGDJ 1):S62-S69 and Standards of Medical Care in         Diabetes - 2011,Diabetes Care,2011,34  (Suppl 1):S11-S61.   CBG: Recent  Labs  Lab 04/15/2019 1827 04/09/2019 2053 03/20/2019 2211 03/23/2019 2248 03/20/2019 2307  GLUCAP 94 72 37* 34* 98    Review of Systems:   Unable to obtain as pt is encephalopathic.  Past medical history  He,  has a past medical history of Diabetes mellitus without complication ([place]), Hepatitis B, chronic ([place]), and Hypertension.   Surgical History   History reviewed. No pertinent surgical history.   Social History   reports that he has never smoked. He has never used smokeless tobacco. He reports current alcohol use. He reports previous drug use.   Family history   His family history is not on file.   Allergies No Known Allergies   Home meds  Prior to Admission medications   Medication Sig Start Date End Date Taking? Authorizing Provider  glipiZIDE (GLUCOTROL XL) 10 MG 24 hr tablet Take 10 mg by mouth 2 (two) times a day. 02/01/18  Yes [provider]  hydrochlorothiazide (HYDRODIURIL) 25 MG tablet Take 25 mg by mouth daily.   Yes [provider]  lovastatin (MEVACOR) 20 MG tablet Take 20 mg by mouth at bedtime.   Yes [provider]  metFORMIN (GLUCOPHAGE-XR) 500 MG 24 hr tablet Take 1,000 mg by mouth daily. 12/06/14  Yes [provider]  oxybutynin (DITROPAN-XL) 10 MG 24 hr tablet Take 10 mg by mouth daily. 02/01/19  Yes [provider]  VEMLIDY 25 MG TABS Take 1 tablet by mouth daily. 03/29/19  Yes [provider]    Critical care time: 60 min.    Montey Hora, [facility] Pager: [phone].  If no answer, [phone] 2019-04-09, 1:26 AM

## 2019-04-18 NOTE — Progress Notes (Signed)
1 wedding band and 1 bracelet placed in labeled pink denture cup.

## 2019-04-18 NOTE — Progress Notes (Signed)
eLink Physician-Brief Progress Note Patient Name: Randy Vaughan DOB: 1961-07-10 MRN: [id number]   Date of Service  29-Apr-2019  HPI/Events of Note  Hyperkalemia  eICU Interventions  Sodium bicarbonate 100 meq iv x 1, Novolog  5 units iv x 1, D 50 W 1 amp iv, Calcium gluconate 2 gm iv x 1, EKG, Kayexalate 30 gm via NGT Q 4 hours x 3        Kristen Fromm U Isauro Skelley 2019/04/29, 4:43 AM

## 2019-04-18 NOTE — Progress Notes (Signed)
When pt arrived to unit pt was A/O x4 with an initial BP of 115/56. At 2211 CBG of 37 was taken pt was given 8 oz of juice per hypoglycemic protocol. At 2230 pt went into A.fib RVR. EKG taken and MD made aware. At 2248 CBG was taken again which resulted as 34. D50 was given. At this time MD came to bedside to discuss RN concerns of decreasing BP, increasing HR, rate change and hypoglycemia. BP slightly increased while MD at bedside. After MD left beside an order for D10 was placed and RN called MD regarding elevated HR and decreasing BP again. Rapid Response was called and came to bedside to evaluate pt. Concerns of starting pt on a drip to combat HR due to BP was discussed between MD rapid response and RN, BP at this time ranged from 65-70's SBP and MAPs in the 50's. MD stated that they were going to consult Cardiology. Rapid Response called critical care and the pt was cardioverted. Pt coded at 0100 and ET tube was placed and ROSC at 0103. Pt was transferred to 80M by RN, rapid response and RTwith belongings.  Audie Box, RN

## 2019-04-18 NOTE — Progress Notes (Signed)
Writer arrived to place PIV per consult. MD @ bedside for central line placement. PIV not needed @ this time. RN instructed to place consult if VAST is needed.

## 2019-04-18 NOTE — Progress Notes (Signed)
Attempted to call wife x3 for FFP consent, left VM. MD notified about being unable to get consent from family. Will continue to monitor.

## 2019-04-18 NOTE — Consult Note (Signed)
WOC consulted for skin tear sustained during a transfer in the ED. Spoke with bedside nurse and reviewed chart. No images at this time. Bedside nurse and ED MD note that is a skin tear with no need for surgical repair. It is bleeding per bedside nurse, he has just added topper dressings for bleeding. I have requested a change to calcium alginate for its hemostatic properties with the next dressing change. If bleeding becomes significant bedside nurse would need to contact CCM provider.  Bedside nurse reports patient is not likely to survive. Will review chart tomorrow for any follow up wound care needs.   Discussed POC with bedside nurse.  Re consult if needed, will not follow at this time. Thanks  Zane Samson [employer], RN,CWOCN, CNS, CWON-AP [phone])

## 2019-04-18 NOTE — Code Documentation (Addendum)
PCCM CODE BLUE NOTE   Patient Name: Randy Vaughan   MRN: [id number]   Date of Birth/ Sex: Oct 16, 1961 , male      Admission Date: 03/30/2019  Attending Provider: Burns Spain, MD  Primary Diagnosis: <principal problem not specified>    Indication: Pt in decompensated cirrhosis in shock on Neo 400 and Vasopressin shock dose receiving 5% albumin visibly altered. Prior to this pt was in SVT received cardioversion 75 J>>> HR 100bpm  Right subclavian CVC placed by PA Not a candidate for RSI>> intubation attempted with bronch but something noted to be obstructing airway. O2 sats dropped to 50s, HR in 30s no pulse felt CODE BLUE called Compressions started immediately  Technical Description:  - CPR performance duration:  3 minutes  - Was defibrillation or cardioversion used? No   - Was external pacer placed? No  - Was patient intubated pre/post CPR? Yes    Medications Administered: Y = Yes; Blank = No Amiodarone    Atropine    Calcium    Epinephrine         Yes x 1   Lidocaine    Magnesium    Norepinephrine    Phenylephrine    Sodium bicarbonate    Vasopressin      Post CPR evaluation:  - Final Status - Was patient successfully resuscitated ? Yes - What is current rhythm? Sinus tach - What is current hemodynamic status? Stable on pressors   Miscellaneous Information:  - Labs sent, including: no  - Primary team notified?  Yes  - Family Notified? Yes  - Additional notes/ transfer status: Was already being evaluated by PCCM for transfer to ICU   [place], Gypsy Balsam, MD  04/09/2019, 1:32 AM

## 2019-04-18 NOTE — Progress Notes (Signed)
  Echocardiogram 2D Echocardiogram has been performed.  Randy Vaughan 04-10-2019, 8:54 AM

## 2019-04-18 NOTE — Consult Note (Signed)
[facility]  INPATIENT CONSULTATION  Reason for Consultation: severe AKI, acidosis, hyperkalemia Requesting Provider: Dr. Zacarias Pontes  HPI: Randy Vaughan is an 58 y.o. 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  Nephrology is consulted for AKI, acidosis and hyperkalemia.   Pt presented to the hospital yesterday AM with increased shoulder pain and LE edema, was found to have AKI with Cr 1.8 up from normal BL.  He was hypotensive, tachycardic.  He was admitted for decompensated cirrhosis, hydrated with NS +albumin given AKI.  Unfortunately he decompensated further through the day, requiring transfer to the ICU.  He developed respiratory failure requiring intubation (brief code due to hypoxia due to difficult intubation), shock, hypothermia, worsening metabolic acidosis (lactate > 11, bicarb 7; 7.03 / 30 /83), hyperkalemia 7.2 > 6.8 and renal failure.  He was treated with CTX and cefepime.  Currently he is on NE, vaso, neo, epi gtts with MAPs in the mid 60s.    Family has not been able to be reached.   PMH: Past Medical History:  Diagnosis Date  . Diabetes mellitus without complication ([place])   . Hepatitis B, chronic ([place])   . Hypertension    PSH: History reviewed. No pertinent surgical history.    Past Medical History:  Diagnosis Date  . Diabetes mellitus without complication ([place])   . Hepatitis B, chronic ([place])   . Hypertension     Medications:  I have reviewed the patient's current medications.  Medications Prior to Admission  Medication Sig Dispense Refill  . glipiZIDE (GLUCOTROL XL) 10 MG 24 hr tablet Take 10 mg by mouth 2 (two) times a day.    . hydrochlorothiazide (HYDRODIURIL) 25 MG tablet Take 25 mg by mouth daily.    Marland Kitchen lovastatin (MEVACOR) 20 MG tablet Take 20 mg by mouth at bedtime.    . metFORMIN (GLUCOPHAGE-XR) 500 MG 24 hr tablet Take 1,000 mg by mouth daily.    Marland Kitchen oxybutynin (DITROPAN-XL) 10 MG 24 hr  tablet Take 10 mg by mouth daily.    . VEMLIDY 25 MG TABS Take 1 tablet by mouth daily.      ALLERGIES:  No Known Allergies  FAM HX: History reviewed. No pertinent family history.  Social History:   reports that he has never smoked. He has never used smokeless tobacco. He reports current alcohol use. He reports previous drug use.  ROS: unable to obtain given patient's status  Blood pressure (!) 107/39, pulse 85, temperature (!) 93.8 F (34.3 C), temperature source Rectal, resp. rate (!) 21, height _0  (1.676 m), weight 94.6 kg, SpO2 99 %. PHYSICAL EXAM: Gen: intubated, sedated  Eyes: icteric ENT: ETT in place CV:  Tachycardic Abd: tense, distended GU: foley Extr: RLE wound dressed, oozing blood tinged fluid Neuro: sedated Skin: cool and dry   Results for orders placed or performed during the hospital encounter of 03/27/2019 (from the past 48 hour(s))  CBC with Differential/Platelet     Status: Abnormal   Collection Time: 04/04/2019  3:23 AM  Result Value Ref Range   WBC 3.0 (L) 4.0 - 10.5 K/uL   RBC 3.70 (L) 4.22 - 5.81 MIL/uL   Hemoglobin 9.3 (L) 13.0 - 17.0 g/dL   HCT 27.7 (L) 39.0 - 52.0 %   MCV 74.9 (L) 80.0 - 100.0 fL   MCH 25.1 (L) 26.0 - 34.0 pg   MCHC 33.6 30.0 - 36.0 g/dL   RDW 20.7 (H)  11.5 - 15.5 %   Platelets 54 (L) 150 - 400 K/uL    Comment: REPEATED TO VERIFY PLATELET COUNT CONFIRMED BY SMEAR SPECIMEN CHECKED FOR CLOTS Immature Platelet Fraction may be clinically indicated, consider ordering this additional test [id number]    nRBC 0.0 0.0 - 0.2 %   Neutrophils Relative % 46 %   Neutro Abs 1.4 (L) 1.7 - 7.7 K/uL   Lymphocytes Relative 8 %   Lymphs Abs 0.3 (L) 0.7 - 4.0 K/uL   Monocytes Relative 7 %   Monocytes Absolute 0.2 0.1 - 1.0 K/uL   Eosinophils Relative 27 %   Eosinophils Absolute 0.8 (H) 0.0 - 0.5 K/uL   Basophils Relative 1 %   Basophils Absolute 0.0 0.0 - 0.1 K/uL   WBC Morphology MILD LEFT SHIFT (1-5% METAS, OCC MYELO, OCC BANDS)     Immature Granulocytes 11 %   Abs Immature Granulocytes 0.32 (H) 0.00 - 0.07 K/uL   Polychromasia PRESENT    Target Cells PRESENT     Comment: Performed at [facility], [address] [place], [place] [address]  Troponin I - ONCE - STAT     Status: None   Collection Time: 04/12/2019  3:23 AM  Result Value Ref Range   Troponin I <0.03 <0.03 ng/mL    Comment: Performed at [place] [address] [place], [place] [address]  Comprehensive metabolic panel     Status: Abnormal   Collection Time: 03/19/2019  3:23 AM  Result Value Ref Range   Sodium 131 (L) 135 - 145 mmol/L   Potassium 4.2 3.5 - 5.1 mmol/L   Chloride 102 98 - 111 mmol/L   CO2 13 (L) 22 - 32 mmol/L   Glucose, Bld 305 (H) 70 - 99 mg/dL   BUN 33 (H) 6 - 20 mg/dL   Creatinine, Ser 1.87 (H) 0.61 - 1.24 mg/dL   Calcium 8.1 (L) 8.9 - 10.3 mg/dL   Total Protein 6.6 6.5 - 8.1 g/dL   Albumin 1.7 (L) 3.5 - 5.0 g/dL   AST 124 (H) 15 - 41 U/L   ALT 78 (H) 0 - 44 U/L   Alkaline Phosphatase 96 38 - 126 U/L   Total Bilirubin 7.5 (H) 0.3 - 1.2 mg/dL   GFR calc non Af Amer 38 (L) >60 mL/min   GFR calc Af Amer 45 (L) >60 mL/min   Anion gap 16 (H) 5 - 15    Comment: Performed at [facility], [place] [address] [place], [place] [address]  CK     Status: None   Collection Time: 04/02/2019  3:23 AM  Result Value Ref Range   Total CK 124 49 - 397 U/L    Comment: Performed at [facility], [place] [address] [place], [place] [address]  Protime-INR     Status: Abnormal   Collection Time: 03/28/2019  3:23 AM  Result Value Ref Range   Prothrombin Time 30.1 (H) 11.4 - 15.2 seconds   INR 2.9 (H) 0.8 - 1.2    Comment: (NOTE) INR goal varies based on device and disease states. Performed at [facility], [place] [address] [place], [place] [address]   Hemoglobin A1c     Status: Abnormal   Collection Time: 04/07/2019  3:23 AM  Result Value Ref Range   Hgb A1c MFr Bld 8.3 (H) 4.8 - 5.6 %    Comment: (NOTE) Pre diabetes:  5.7%-6.4% Diabetes:              >6.4% Glycemic control for   <7.0% adults with diabetes    Mean Plasma Glucose 191.51 mg/dL    Comment: Performed at [facility], [place] [address], [place], [place] [address]  Ammonia     Status: Abnormal   Collection Time: 04/15/2019  3:40 AM  Result Value Ref Range   Ammonia 60 (H) 9 - 35 umol/L    Comment: Performed at [facility], [place] [address] [place], [place] [address]  SARS Coronavirus 2 [facility] order, Performed in [facility] hospital lab)     Status: None   Collection Time: 03/18/2019  3:40 AM  Result Value Ref Range   SARS Coronavirus 2 NEGATIVE NEGATIVE    Comment: (NOTE) If result is NEGATIVE SARS-CoV-2 target nucleic acids are NOT DETECTED. The SARS-CoV-2 RNA is generally detectable in upper and lower  respiratory specimens during the acute phase of infection. The lowest  concentration of SARS-CoV-2 viral copies this assay can detect is 250  copies / mL. A negative result does not preclude SARS-CoV-2 infection  and should not be used as the sole basis for treatment or other  patient management decisions.  A negative result may occur with  improper specimen collection / handling, submission of specimen other  than nasopharyngeal swab, presence of viral mutation(s) within the  areas targeted by this assay, and inadequate number of viral copies  (<250 copies / mL). A negative result must be combined with clinical  observations, patient history, and epidemiological information. If result is POSITIVE SARS-CoV-2 target nucleic acids are DETECTED. The SARS-CoV-2 RNA is generally detectable in upper and lower  respiratory specimens dur ing the acute phase of infection.  Positive  results are indicative of active infection with SARS-CoV-2.  Clinical  correlation with patient history and other diagnostic information is  necessary to determine patient infection status.  Positive results do  not rule out bacterial infection or  co-infection with other viruses. If result is PRESUMPTIVE POSTIVE SARS-CoV-2 nucleic acids MAY BE PRESENT.   A presumptive positive result was obtained on the submitted specimen  and confirmed on repeat testing.  While 2019 novel coronavirus  (SARS-CoV-2) nucleic acids may be present in the submitted sample  additional confirmatory testing may be necessary for epidemiological  and / or clinical management purposes  to differentiate between  SARS-CoV-2 and other Sarbecovirus currently known to infect humans.  If clinically indicated additional testing with an alternate test  methodology [id number]) is advised. The SARS-CoV-2 RNA is generally  detectable in upper and lower respiratory sp ecimens during the acute  phase of infection. The expected result is Negative. Fact Sheet for Patients:  StrictlyIdeas.no Fact Sheet for Healthcare Providers: BankingDealers.co.za This test is not yet approved or cleared by the [place] FDA and has been authorized for detection and/or diagnosis of SARS-CoV-2 by FDA under an Emergency Use Authorization (EUA).  This EUA will remain in effect (meaning this test can be used) for the duration of the COVID-19 declaration under Section 564(b)(1) of the Act, 21 U.S.C. section 360bbb-3(b)(1), unless the authorization is terminated or revoked sooner. Performed at [facility], [place] [address] [place], [place] [address]   Urinalysis, Routine w reflex microscopic     Status: Abnormal   Collection Time: 03/18/2019  6:30 AM  Result Value Ref Range   Color, Urine AMBER (A) YELLOW    Comment: BIOCHEMICALS MAY BE AFFECTED  BY COLOR   APPearance HAZY (A) CLEAR   Specific Gravity, Urine 1.023 1.005 - 1.030   pH 5.0 5.0 - 8.0   Glucose, UA >=500 (A) NEGATIVE mg/dL   Hgb urine dipstick SMALL (A) NEGATIVE   Bilirubin Urine SMALL (A) NEGATIVE   Ketones, ur NEGATIVE NEGATIVE mg/dL   Protein, ur 30 (A) NEGATIVE mg/dL    Nitrite NEGATIVE NEGATIVE   Leukocytes,Ua NEGATIVE NEGATIVE   RBC / HPF 0-5 0 - 5 RBC/hpf   WBC, UA 11-20 0 - 5 WBC/hpf   Bacteria, UA MANY (A) NONE SEEN   Squamous Epithelial / LPF 0-5 0 - 5   Mucus PRESENT    Hyaline Casts, UA PRESENT     Comment: Performed at [facility] [place] [address] [place], [place] [address]  CBG monitoring, ED     Status: Abnormal   Collection Time: 04/07/2019 10:12 AM  Result Value Ref Range   Glucose-Capillary 191 (H) 70 - 99 mg/dL  CBG monitoring, ED     Status: Abnormal   Collection Time: 04/14/2019 12:46 PM  Result Value Ref Range   Glucose-Capillary 129 (H) 70 - 99 mg/dL  CBG monitoring, ED     Status: Abnormal   Collection Time: 04/17/2019  2:11 PM  Result Value Ref Range   Glucose-Capillary 104 (H) 70 - 99 mg/dL   Comment 1 Notify RN    Comment 2 Document in Chart   CBG monitoring, ED     Status: Abnormal   Collection Time: 04/02/2019  5:15 PM  Result Value Ref Range   Glucose-Capillary 44 (LL) 70 - 99 mg/dL   Comment 1 Notify RN    Comment 2 Document in Chart   Culture, blood (routine x 2)     Status: None (Preliminary result)   Collection Time: 04/03/2019  5:39 PM  Result Value Ref Range   Specimen Description BLOOD LEFT ANTECUBITAL    Special Requests      BOTTLES DRAWN AEROBIC AND ANAEROBIC Blood Culture adequate volume   Culture  Setup Time      GRAM NEGATIVE RODS IN BOTH AEROBIC AND ANAEROBIC BOTTLES CRITICAL VALUE NOTED.  VALUE IS CONSISTENT WITH PREVIOUSLY REPORTED AND CALLED VALUE. Performed at [facility], [place] [address] [place], [place] [address]    Culture GRAM NEGATIVE RODS    Report Status PENDING   CBG monitoring, ED     Status: Abnormal   Collection Time: 04/11/2019  5:43 PM  Result Value Ref Range   Glucose-Capillary 37 (LL) 70 - 99 mg/dL   Comment 1 Notify RN    Comment 2 Document in Chart   Culture, blood (routine x 2)     Status: None (Preliminary result)   Collection Time: 03/27/2019  5:45 PM  Result Value  Ref Range   Specimen Description BLOOD RIGHT ANTECUBITAL    Special Requests      BOTTLES DRAWN AEROBIC AND ANAEROBIC Blood Culture results may not be optimal due to an excessive volume of blood received in culture bottles   Culture  Setup Time      GRAM NEGATIVE RODS IN BOTH AEROBIC AND ANAEROBIC BOTTLES Organism ID to follow CRITICAL RESULT CALLED TO, READ BACK BY AND VERIFIED WITH: PHRMD L SEAY _0  04/01/19 BY S GEZAHEGN Performed at [facility], [place] [address], [place], [place] [address]    Culture GRAM NEGATIVE RODS    Report Status PENDING   Blood Culture ID Panel (Reflexed)  Status: None   Collection Time: 04/03/2019  5:45 PM  Result Value Ref Range   Enterococcus species NOT DETECTED NOT DETECTED   Listeria monocytogenes NOT DETECTED NOT DETECTED   Staphylococcus species NOT DETECTED NOT DETECTED   Staphylococcus aureus (BCID) NOT DETECTED NOT DETECTED   Streptococcus species NOT DETECTED NOT DETECTED   Streptococcus agalactiae NOT DETECTED NOT DETECTED   Streptococcus pneumoniae NOT DETECTED NOT DETECTED   Streptococcus pyogenes NOT DETECTED NOT DETECTED   Acinetobacter baumannii NOT DETECTED NOT DETECTED   Enterobacteriaceae species NOT DETECTED NOT DETECTED   Enterobacter cloacae complex NOT DETECTED NOT DETECTED   Escherichia coli NOT DETECTED NOT DETECTED   Klebsiella oxytoca NOT DETECTED NOT DETECTED   Klebsiella pneumoniae NOT DETECTED NOT DETECTED   Proteus species NOT DETECTED NOT DETECTED   Serratia marcescens NOT DETECTED NOT DETECTED   Haemophilus influenzae NOT DETECTED NOT DETECTED   Neisseria meningitidis NOT DETECTED NOT DETECTED   Pseudomonas aeruginosa NOT DETECTED NOT DETECTED   Candida albicans NOT DETECTED NOT DETECTED   Candida glabrata NOT DETECTED NOT DETECTED   Candida krusei NOT DETECTED NOT DETECTED   Candida parapsilosis NOT DETECTED NOT DETECTED   Candida tropicalis NOT DETECTED NOT DETECTED    Comment: Performed at [facility], [address], [place], [place] [address]  Comprehensive metabolic panel     Status: Abnormal   Collection Time: 03/22/2019  6:00 PM  Result Value Ref Range   Sodium 132 (L) 135 - 145 mmol/L   Potassium 4.2 3.5 - 5.1 mmol/L   Chloride 103 98 - 111 mmol/L   CO2 11 (L) 22 - 32 mmol/L   Glucose, Bld 113 (H) 70 - 99 mg/dL   BUN 42 (H) 6 - 20 mg/dL   Creatinine, Ser 2.72 (H) 0.61 - 1.24 mg/dL   Calcium 8.2 (L) 8.9 - 10.3 mg/dL   Total Protein 6.2 (L) 6.5 - 8.1 g/dL   Albumin 1.7 (L) 3.5 - 5.0 g/dL   AST 117 (H) 15 - 41 U/L   ALT 71 (H) 0 - 44 U/L   Alkaline Phosphatase 70 38 - 126 U/L   Total Bilirubin 7.0 (H) 0.3 - 1.2 mg/dL   GFR calc non Af Amer 24 (L) >60 mL/min   GFR calc Af Amer 28 (L) >60 mL/min   Anion gap 18 (H) 5 - 15    Comment: Performed at [facility], [place] [address] [place], [place] [address]  CBG monitoring, ED     Status: None   Collection Time: 04/11/2019  6:27 PM  Result Value Ref Range   Glucose-Capillary 94 70 - 99 mg/dL  Glucose, capillary     Status: None   Collection Time: 03/22/2019  8:53 PM  Result Value Ref Range   Glucose-Capillary 72 70 - 99 mg/dL  MRSA PCR Screening     Status: None   Collection Time: 04/10/2019  9:17 PM  Result Value Ref Range   MRSA by PCR NEGATIVE NEGATIVE    Comment:        The GeneXpert MRSA Assay (FDA approved for NASAL specimens only), is one component of a comprehensive MRSA colonization surveillance program. It is not intended to diagnose MRSA infection nor to guide or monitor treatment for MRSA infections. Performed at [facility], [place] [address] [place], [place] [address]   Glucose, capillary     Status: Abnormal   Collection Time: 03/29/2019 10:11 PM  Result Value Ref Range  Glucose-Capillary 37 (LL) 70 - 99 mg/dL   Comment 1 Notify RN   Glucose, capillary     Status: Abnormal   Collection Time: 03/23/2019 10:48 PM  Result Value Ref Range   Glucose-Capillary 34 (LL) 70 - 99 mg/dL   Comment 1  Notify RN   Glucose, capillary     Status: None   Collection Time: 04/03/2019 11:07 PM  Result Value Ref Range   Glucose-Capillary 98 70 - 99 mg/dL   Comment 1 Notify RN    Comment 2 Document in Chart   Glucose, capillary     Status: Abnormal   Collection Time: Apr 23, 2019  1:40 AM  Result Value Ref Range   Glucose-Capillary 44 (LL) 70 - 99 mg/dL   Comment 1 Repeat Test   Glucose, capillary     Status: Abnormal   Collection Time: 04-23-19  1:43 AM  Result Value Ref Range   Glucose-Capillary 43 (LL) 70 - 99 mg/dL  Comprehensive metabolic panel     Status: Abnormal   Collection Time: 2019/04/23  2:00 AM  Result Value Ref Range   Sodium 133 (L) 135 - 145 mmol/L   Potassium 6.7 (HH) 3.5 - 5.1 mmol/L    Comment: CRITICAL RESULT CALLED TO, READ BACK BY AND VERIFIED WITH: K LEE Apr 23, 2019 AT 0300 BY H SOEWARDIMAN    Chloride 105 98 - 111 mmol/L   CO2 7 (L) 22 - 32 mmol/L   Glucose, Bld 179 (H) 70 - 99 mg/dL   BUN 43 (H) 6 - 20 mg/dL   Creatinine, Ser 3.28 (H) 0.61 - 1.24 mg/dL   Calcium 7.9 (L) 8.9 - 10.3 mg/dL   Total Protein 5.2 (L) 6.5 - 8.1 g/dL   Albumin 1.5 (L) 3.5 - 5.0 g/dL   AST 135 (H) 15 - 41 U/L   ALT 62 (H) 0 - 44 U/L   Alkaline Phosphatase 66 38 - 126 U/L   Total Bilirubin 5.7 (H) 0.3 - 1.2 mg/dL   GFR calc non Af Amer 20 (L) >60 mL/min   GFR calc Af Amer 23 (L) >60 mL/min   Anion gap 21 (H) 5 - 15    Comment: Performed at [facility], [place] [address] [place], [place] [address]  Protime-INR     Status: Abnormal   Collection Time: 2019-04-23  2:00 AM  Result Value Ref Range   Prothrombin Time 58.0 (H) 11.4 - 15.2 seconds   INR 6.8 (HH) 0.8 - 1.2    Comment: CRITICAL RESULT CALLED TO, READ BACK BY AND VERIFIED WITH: RN LEE TARIN AT 0252 ON 04/23/19 BY MHOUEGNIFIO (NOTE) INR goal varies based on device and disease states. Performed at [facility], [place] [address] [place], [place] [address]   Lactic acid, plasma     Status: Abnormal   Collection Time:  04-23-2019  2:00 AM  Result Value Ref Range   Lactic Acid, Venous >11.0 (HH) 0.5 - 1.9 mmol/L    Comment: CRITICAL RESULT CALLED TO, READ BACK BY AND VERIFIED WITH: [facility] [facility] RN _0  ON [id number] BY FLEMINGS Performed at [place] [address] [place],  [address]   .Cooxemetry Panel (carboxy, met, total hgb, O2 sat)     Status: Abnormal   Collection Time: 04/23/19  2:10 AM  Result Value Ref Range   Total hemoglobin 7.8 (L) 12.0 - 16.0 g/dL   O2 Saturation 92.2 %   Carboxyhemoglobin 1.2 0.5 - 1.5 %   Methemoglobin 2.0 (H)  0.0 - 1.5 %  Glucose, capillary     Status: Abnormal   Collection Time: 04-20-19  2:11 AM  Result Value Ref Range   Glucose-Capillary 155 (H) 70 - 99 mg/dL  I-STAT 7, (LYTES, BLD GAS, ICA, H+H)     Status: Abnormal   Collection Time: Apr 20, 2019  2:28 AM  Result Value Ref Range   pH, Arterial 6.935 (LL) 7.350 - 7.450   pCO2 arterial 44.3 32.0 - 48.0 mmHg   pO2, Arterial 503.0 (H) 83.0 - 108.0 mmHg   Bicarbonate 9.4 (L) 20.0 - 28.0 mmol/L   TCO2 11 (L) 22 - 32 mmol/L   O2 Saturation 100.0 %   Acid-base deficit 22.0 (H) 0.0 - 2.0 mmol/L   Sodium 133 (L) 135 - 145 mmol/L   Potassium 6.5 (HH) 3.5 - 5.1 mmol/L   Calcium, Ion 1.08 (L) 1.15 - 1.40 mmol/L   HCT 25.0 (L) 39.0 - 52.0 %   Hemoglobin 8.5 (L) 13.0 - 17.0 g/dL   Patient temperature HIDE    Collection site ARTERIAL LINE    Drawn by RT    Sample type ARTERIAL    Comment NOTIFIED PHYSICIAN   Cortisol     Status: None   Collection Time: 04/20/19  3:10 AM  Result Value Ref Range   Cortisol, Plasma >100.0 ug/dL    Comment: RESULTS CONFIRMED BY MANUAL DILUTION Performed at [facility], [address], [place], [place] [address]   Potassium     Status: Abnormal   Collection Time: 04/20/2019  3:10 AM  Result Value Ref Range   Potassium 7.0 (HH) 3.5 - 5.1 mmol/L    Comment: CRITICAL RESULT CALLED TO, READ BACK BY AND VERIFIED WITH: RN A BIAZ _0  April 20, 2019 BY S GEZAHEGN Performed at  [facility], [place] [address] [place], [place] [address]   Prepare fresh frozen plasma     Status: None (Preliminary result)   Collection Time: 04-20-2019  3:10 AM  Result Value Ref Range   Unit Number [id number]    Blood Component Type THW PLS APHR    Unit division B0    Status of Unit ISSUED    Transfusion Status OK TO TRANSFUSE    Unit Number [id number]    Blood Component Type THW PLS APHR    Unit division A0    Status of Unit ISSUED    Transfusion Status      OK TO TRANSFUSE Performed at [facility], [place] [address], [place], [place] [address]   Type and screen [place]     Status: None   Collection Time: April 20, 2019  3:27 AM  Result Value Ref Range   ABO/RH(D) A POS    Antibody Screen NEG    Sample Expiration      04/03/2019,2359 Performed at [facility], [place] [address] [place], [place] [address]   Glucose, capillary     Status: Abnormal   Collection Time: 04/20/2019  3:30 AM  Result Value Ref Range   Glucose-Capillary 122 (H) 70 - 99 mg/dL  I-STAT 7, (LYTES, BLD GAS, ICA, H+H)     Status: Abnormal   Collection Time: 04-20-19  4:02 AM  Result Value Ref Range   pH, Arterial 6.937 (LL) 7.350 - 7.450   pCO2 arterial 33.7 32.0 - 48.0 mmHg   pO2, Arterial 110.0 (H) 83.0 - 108.0 mmHg   Bicarbonate 7.2 (L) 20.0 - 28.0 mmol/L   TCO2 8 (L) 22 - 32  mmol/L   O2 Saturation 94.0 %   Acid-base deficit 24.0 (H) 0.0 - 2.0 mmol/L   Sodium 132 (L) 135 - 145 mmol/L   Potassium 6.7 (HH) 3.5 - 5.1 mmol/L   Calcium, Ion 1.04 (L) 1.15 - 1.40 mmol/L   HCT 26.0 (L) 39.0 - 52.0 %   Hemoglobin 8.8 (L) 13.0 - 17.0 g/dL   Patient temperature HIDE    Collection site ARTERIAL LINE    Drawn by RT    Sample type ARTERIAL    Comment NOTIFIED PHYSICIAN   Glucose, capillary     Status: Abnormal   Collection Time: 2019/04/23  5:27 AM  Result Value Ref Range   Glucose-Capillary 307 (H) 70 - 99 mg/dL  Basic metabolic panel     Status: Abnormal   Collection  Time: 2019/04/23  5:31 AM  Result Value Ref Range   Sodium 136 135 - 145 mmol/L   Potassium 7.2 (HH) 3.5 - 5.1 mmol/L    Comment: CRITICAL RESULT CALLED TO, READ BACK BY AND VERIFIED WITH: C WOFFORD 04-23-2019 AT 0607 BY H SOEWARDIMAN    Chloride 100 98 - 111 mmol/L   CO2 8 (L) 22 - 32 mmol/L   Glucose, Bld 126 (H) 70 - 99 mg/dL   BUN 43 (H) 6 - 20 mg/dL   Creatinine, Ser 3.27 (H) 0.61 - 1.24 mg/dL   Calcium 7.5 (L) 8.9 - 10.3 mg/dL   GFR calc non Af Amer 20 (L) >60 mL/min   GFR calc Af Amer 23 (L) >60 mL/min   Anion gap 28 (H) 5 - 15    Comment: Performed at [facility], [address] [place], [place] [address]  I-STAT 7, (LYTES, BLD GAS, ICA, H+H)     Status: Abnormal   Collection Time: 04/23/19  5:47 AM  Result Value Ref Range   pH, Arterial 7.031 (LL) 7.350 - 7.450   pCO2 arterial 29.9 (L) 32.0 - 48.0 mmHg   pO2, Arterial 83.0 83.0 - 108.0 mmHg   Bicarbonate 7.9 (L) 20.0 - 28.0 mmol/L   TCO2 9 (L) 22 - 32 mmol/L   O2 Saturation 90.0 %   Acid-base deficit 21.0 (H) 0.0 - 2.0 mmol/L   Sodium 134 (L) 135 - 145 mmol/L   Potassium 6.8 (HH) 3.5 - 5.1 mmol/L   Calcium, Ion 1.00 (L) 1.15 - 1.40 mmol/L   HCT 20.0 (L) 39.0 - 52.0 %   Hemoglobin 6.8 (LL) 13.0 - 17.0 g/dL   Patient temperature HIDE    Collection site ARTERIAL LINE    Drawn by RT    Sample type ARTERIAL    Comment NOTIFIED PHYSICIAN   Glucose, capillary     Status: Abnormal   Collection Time: 2019-04-23  6:08 AM  Result Value Ref Range   Glucose-Capillary 159 (H) 70 - 99 mg/dL    Dg Shoulder Right  Result Date: 04/17/2019 CLINICAL DATA:  Pain for 2 days EXAM: RIGHT SHOULDER - 2+ VIEW COMPARISON:  None. FINDINGS: Frontal, oblique, Y scapular, and axillary images were obtained. There is no appreciable fracture or dislocation. There is mild widening of the acromioclavicular and coracoclavicular spaces, raising concern for potential separation in these areas. There is no appreciable joint space narrowing. There is  mild bony overgrowth along the lateral right clavicle. No intra-articular calcification evident. Visualized right lung clear. IMPRESSION: Question degree of acromioclavicular and coracoclavicular separation. No frank dislocation. No fracture. No appreciable joint space narrowing or erosion. Slight bony overgrowth along lateral  right clavicle. Electronically Signed   By: Lowella Grip III M.D.   On: 04/04/2019 09:01   Ct Angio Chest Pe W And/or Wo Contrast  Result Date: 04/03/2019 CLINICAL DATA:  58 year old male with right side chest pain and shortness of breath for 2 days. Bilateral lower extremity swelling. EXAM: CT ANGIOGRAPHY CHEST WITH CONTRAST TECHNIQUE: Multidetector CT imaging of the chest was performed using the standard protocol during bolus administration of intravenous contrast. Multiplanar CT image reconstructions and MIPs were obtained to evaluate the vascular anatomy. CONTRAST:  33m OMNIPAQUE IOHEXOL 350 MG/ML SOLN COMPARISON:  Portable chest earlier today. CT Abdomen and Pelvis 12/05/2014. FINDINGS: Cardiovascular: Adequate contrast bolus timing in the pulmonary arterial tree. Mild respiratory motion. No focal filling defect identified in the pulmonary arteries to suggest acute pulmonary embolism. Cardiomegaly with no pericardial effusion. Negative visible aorta aside from mild calcified atherosclerosis. Mediastinum/Nodes: Negative. Lungs/Pleura: Low lung volumes, lower than in 2016. Crowding of bronchovascular structures bilaterally. Questionable mild septal thickening. No pleural effusion or consolidation. There is a 6 millimeter right lower lobe lung nodule on image 87 of series 6. There is mild thickening along the right minor fissure. Upper Abdomen: Streak artifact in the upper abdomen. There is a small volume of ascites in the upper abdomen which is new since 2016. Hepatic cirrhosis demonstrated on the prior CT. No focal abnormality of the visible liver, spleen, pancreas, adrenal  glands or kidneys. Poor definition of the gallbladder. Mild wall thickening of decompressed transverse colon. Musculoskeletal: No acute osseous abnormality identified. Review of the MIP images confirms the above findings. IMPRESSION: 1. No acute pulmonary embolus identified. 2. Low lung volumes. Questionable pulmonary septal thickening. Consider mild interstitial edema. 3. New small volume ascites in the upper abdomen since 2016, most likely related to progressed cirrhosis given findings on the prior CT Abdomen and Pelvis. Electronically Signed   By: HGenevie AnnM.D.   On: 04/08/2019 05:56   Dg Chest Port 1 View  Result Date: 52020/06/02CLINICAL DATA:  Central line placement. Endotracheal tube placement. Post CPR. EXAM: PORTABLE CHEST 1 VIEW COMPARISON:  Radiograph and chest CT yesterday. FINDINGS: Endotracheal tube tip high in positioning 9.7 cm from the carina. Recommend advancement of 3-4 cm. Left subclavian central line tip in the region of the mid SVC. No pneumothorax, suspect skin fold or external artifact projecting about the lower lateral left hemithorax. Unchanged heart size and mediastinal contours. Progressive vascular congestion and probable pulmonary edema. Minimal blunting of costophrenic angles suggests small effusions. IMPRESSION: 1. Endotracheal tube tip high in positioning 9.7 cm from the carina. Recommend advancement of 3-4 cm. 2. Left subclavian central line tip in the mid SVC. No pneumothorax. 3. Progressive vascular congestion and probable pulmonary edema from yesterday. Electronically Signed   By: MKeith RakeM.D.   On: 0Jun 02, 202002:00   Dg Chest Portable 1 View  Result Date: 03/25/2019 CLINICAL DATA:  Hypotension and shoulder pain EXAM: PORTABLE CHEST 1 VIEW COMPARISON:  None. FINDINGS: The heart size and mediastinal contours are within normal limits. Both lungs are clear. The visualized skeletal structures are unremarkable. IMPRESSION: No active disease. Electronically Signed   By:  KUlyses JarredM.D.   On: 04/03/2019 03:58   Dg Abd Portable 1v  Result Date: 52020-06-02CLINICAL DATA:  Orogastric tube placement. EXAM: PORTABLE ABDOMEN - 1 VIEW COMPARISON:  None. FINDINGS: Tip and side port of the enteric tube below the diaphragm in the stomach. Additional overlying monitoring devices project over the lower chest and upper  abdomen. Mild gaseous distention of bowel loops in the central abdomen. IMPRESSION: Tip and side port of the enteric tube below the diaphragm in the stomach. Electronically Signed   By: Keith Rake M.D.   On: 04-11-2019 02:18   Ir Abdomen US Limited  Result Date: 04/09/2019 CLINICAL DATA:  Decompensated a patent cirrhosis. Evaluate for intra-abdominal ascites and perform ultrasound-guided paracentesis as indicated. EXAM: LIMITED ABDOMEN ULTRASOUND FOR ASCITES TECHNIQUE: Limited ultrasound survey for ascites was performed in all four abdominal quadrants. COMPARISON:  Chest CT-03/24/2019 FINDINGS: Sonographic evaluation of the abdomen is negative for any significant intra-abdominal ascites. As such, no ultrasound-guided paracentesis was attempted. IMPRESSION: No intra-abdominal ascites. Ultrasound-guided paracentesis not attempted. Electronically Signed   By: Sandi Mariscal M.D.   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    Unfortunately at this time the patient's shock and associated hypotension is so profound that CRRT is not possible.  Initiation of the procedure would likely lead to acute further decompensation and likely code/death.  I have discussed with RN and DO from PCCM.  He has a hemodialysis catheter and certainly if he should hemodynamically stabilize to a point that would allow CRRT would be happy to offer in the midst of this acute critical illness.  I have asked both to  contact me directly if he improves enough to allow this and I will also plan to check in periodically through the day to assess.   Justin Mend 11-Apr-2019, 7:49 AM

## 2019-04-18 NOTE — Progress Notes (Signed)
PHARMACY - PHYSICIAN COMMUNICATION CRITICAL VALUE ALERT - BLOOD CULTURE IDENTIFICATION (BCID)  Randy Vaughan is an 58 y.o. male who presented to [facility] on 04/14/2019 with a chief complaint of shoulder pain  Assessment:  4/4 BC positive for gnr.  No BCID detected  Name of physician (or Provider) Contacted: n/a  Current antibiotics: Rocephin  Changes to prescribed antibiotics recommended:  none  Results for orders placed or performed during the hospital encounter of 04/15/2019  Blood Culture ID Panel (Reflexed) (Collected: 03/22/2019  5:45 PM)  Result Value Ref Range   Enterococcus species NOT DETECTED NOT DETECTED   Listeria monocytogenes NOT DETECTED NOT DETECTED   Staphylococcus species NOT DETECTED NOT DETECTED   Staphylococcus aureus (BCID) NOT DETECTED NOT DETECTED   Streptococcus species NOT DETECTED NOT DETECTED   Streptococcus agalactiae NOT DETECTED NOT DETECTED   Streptococcus pneumoniae NOT DETECTED NOT DETECTED   Streptococcus pyogenes NOT DETECTED NOT DETECTED   Acinetobacter baumannii NOT DETECTED NOT DETECTED   Enterobacteriaceae species NOT DETECTED NOT DETECTED   Enterobacter cloacae complex NOT DETECTED NOT DETECTED   Escherichia coli NOT DETECTED NOT DETECTED   Klebsiella oxytoca NOT DETECTED NOT DETECTED   Klebsiella pneumoniae NOT DETECTED NOT DETECTED   Proteus species NOT DETECTED NOT DETECTED   Serratia marcescens NOT DETECTED NOT DETECTED   Haemophilus influenzae NOT DETECTED NOT DETECTED   Neisseria meningitidis NOT DETECTED NOT DETECTED   Pseudomonas aeruginosa NOT DETECTED NOT DETECTED   Candida albicans NOT DETECTED NOT DETECTED   Candida glabrata NOT DETECTED NOT DETECTED   Candida krusei NOT DETECTED NOT DETECTED   Candida parapsilosis NOT DETECTED NOT DETECTED   Candida tropicalis NOT DETECTED NOT DETECTED    Woodfin Ganja 2019-04-04  4:15 AM

## 2019-04-18 NOTE — Progress Notes (Signed)
ABG, drawn critical results called to Dr. Warrick Parisian.

## 2019-04-18 NOTE — Progress Notes (Signed)
[id number] Call placed to [facility]S for referral. Referral #[id number] Return call from [facility]S coordinator for further information. Coordinator to check with his supervisor, then call back with determination.

## 2019-04-18 NOTE — Progress Notes (Signed)
ABG drawn, critical results given to RN. 

## 2019-04-18 NOTE — Progress Notes (Signed)
Pt went asystole at 1051.  Code Blue was immediality initiated "See code sheet".  Dr. Trudie Buckler in room running code. Family was in room during code  Pt expired at 1102.    Erick Blinks, RN

## 2019-04-18 NOTE — Procedures (Signed)
Intubation Procedure Note Randy Vaughan [id number] 03-Feb-1961  Procedure: Intubation Indications: Respiratory insufficiency  Procedure Details Consent: Unable to obtain consent because of altered level of consciousness. Time Out: Verified patient identification, verified procedure, site/side was marked, verified correct patient position, special equipment/implants available, medications/allergies/relevent history reviewed, required imaging and test results available.  Performed  Maximum sterile technique was used including cap, gloves, hand hygiene and mask.  MAC and 4  Initially attempted to intubate with AMBU bronch due to hemodynamic instability and not able to give full RSI. Able to advance bronch but could not get a clear view something obstructing Pt desaturated, no pulses felt  Converted to Video Laryngoscope>>> intubated w/ 7.5 ETT At 21 at lip>> ROSC achieved post intubation Partial denture seen very close to the vocal cords. Removed with forceps.   Evaluation Hemodynamic Status: Persistent hypotension treated with pressors; O2 sats: transiently fell during during procedure Patient's Current Condition: unstable Complications: No apparent complications Patient did tolerate procedure well. Chest X-ray ordered to verify placement.  CXR: tube position high-repostitioned.   Randy Vaughan 04/28/2019

## 2019-04-18 NOTE — Progress Notes (Signed)
CDS called with TOD. 

## 2019-04-18 NOTE — Progress Notes (Signed)
  Echocardiogram 2D Echocardiogram has been performed.  Celene Skeen 04/11/2019, 8:55 AM

## 2019-04-18 NOTE — Progress Notes (Signed)
Wasted 240cc of fentanyl in narcotic bin. Witnessed by Lolita Patella RN.  Erick Blinks, RN

## 2019-04-18 NOTE — Progress Notes (Signed)
   03/24/2019 0128  Clinical Encounter Type  Visited With Health care provider  Visit Type Code  Referral From Nurse  Consult/Referral To Chaplain   Chaplain responded to Code Blue for this Patient.  Family not present.  Patient being moved to 3M05.  Chaplain available for family support if they are notified to come.   Chaplain Agustin Cree, MDiv.

## 2019-04-18 NOTE — Progress Notes (Addendum)
eLink Physician-Brief Progress Note Patient Name: Randy Vaughan DOB: [birth year] MRN: [id number]   Date of Service  04/15/2019  HPI/Events of Note  Pt with end stage liver disease and encephalopathy who developed atrial fibrillation with RVR this PM (HR 130 -150's). He was hypotensive with SBP 60's  eICU Interventions  Phenylephrine infusion started, Vasopressin infusion ordered, 25 % Albumin 50 gm iv ordered, patient cardioverted with 75 joules, PCCM on the ground requested to evaluate regarding intubation, central line and ongoing Rx for shock and respiratory failure. Stat H & H ordered to exclude GI bleeding as contributing factor to hypotension.        Thomasene Lot Ogan 04/11/2019, 12:41 AM

## 2019-04-18 NOTE — Procedures (Signed)
Central Venous Catheter Insertion Procedure Note Randy Vaughan [id number] March 14, 1961  Procedure: Insertion of Central Venous Catheter Indications: Assessment of intravascular volume, Drug and/or fluid administration and Frequent blood sampling  Procedure Details Consent: Unable to obtain consent because of emergent medical necessity. Time Out: Verified patient identification, verified procedure, site/side was marked, verified correct patient position, special equipment/implants available, medications/allergies/relevent history reviewed, required imaging and test results available.  Performed  Maximum sterile technique was used including antiseptics, cap, gloves, gown, hand hygiene, mask and sheet. Skin prep: Chlorhexidine; local anesthetic administered A antimicrobial bonded/coated triple lumen catheter was placed in the left subclavian vein using the Seldinger technique.  Evaluation Blood flow good Complications: No apparent complications Patient did tolerate procedure well. Chest X-ray ordered to verify placement.  CXR: normal.   Randy Guys, PA - [facility] Pager: [phone]  or [phone] 2019/04/28, 2:06 AM

## 2019-04-18 NOTE — Death Summary Note (Signed)
DEATH SUMMARY   Patient Details  Name: Randy Vaughan MRN: [id number] DOB: Dec 01, 1960  Admission/Discharge Information   Admit Date:  Apr 08, 2019  Date of Death: Date of Death: 04-09-2019  Time of Death: Time of Death: 35  Length of Stay: 1  Referring Physician: System, Pcp Not In   Reason(s) for Hospitalization  Septic shock 2/2 gn bacteremia  Diagnoses  Preliminary cause of death: Sepsis ([place]) Secondary Diagnoses (including complications and co-morbidities):  Active Problems:   Decompensated hepatic cirrhosis (HCC)   Acute respiratory failure with hypoxia (HCC)   Anasarca   Cirrhosis of liver with ascites (HCC)   Hyperammonemia [facility])   Brief Hospital Course (including significant findings, care, treatment, and services provided and events leading to death)  Randy Vaughan is a 58 y.o. 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 He returned from his last trip to [place], [place] on Monday morning. He began to notice worsening bilateral lower extremity edema. This has occurred one other time in the past year which resolved with diuretics. He does not take diuretics normally. He reports felling thirstier and has been drinking up to 4L of water a day. He feels that his mouth is dry and that he is dehydrated. Denies SOB, chest pain, and heart palpitations. The main reason for his ED visit today was his right shoulder pain. He woke from sleep with severe shoulder pain and inability to move his right shoulder. He denies any current or past injury. He has never experienced this before. ROS also negative for fever, chills, sick contacts, N/V, constipation, and diarrhea.   On arrival to the ED, patient was hypotensive with BP 85/58 and tachycardic to 125 with RR  23, and temp 46F. Lower extremity dopplers were unable to be obtained overnight but CTA chest was negative for PE. CT also noted mild interstitial edema and small volume ascites in the upper abdomen, new since prior imaging in 2016. Labs were significant for pancytopenia (wbc 3.0, hgb 9.3, and plts 54), mild hyponatremia 131 which corrects to normal with his hyperglycemia (glucose 305), renal dysfunction with Scr 1.8 and GFR 38-45 (basline normal Scr 0.7). Albumin was low 1.7 and AST elevated 124, ALT 78, and T bili 7.5 with normal alk phos. Also with anion gap of 16. He was given a 500 cc bolus and IV albumin.    Per CCM consult Apr 09, 2023 am:  He was admitted by IMTS and was evaluated by Dr. Benson Norway of GI who recommended continuing his tenofovir and starting albumin and midodrine.  He was started on empiric ceftriaxone for mild ascites (not enough to warrant diagnostic paracentesis).  Dr. Benson Norway had recommended that [facility] be contacted again regarding the potential for consideration of liver transplant.  During early AM hours 08-Apr-2023, PCCM was called in consultation urgently due to HR in 160s with worsening hypotension.  He had episode of A.fib with RVR prior to our arrival.  At the time of our arrival, HR was 140's with SBP in 60's despite 237mg/min neosynephrine. We performed synchronized cardioversion with 75J.  HR improved to ~100 and SBP ranged anywhere from 60's to 80s.  Neo was increased to 4041m/min.  Due to poor IV access, I performed emergent left sided subclavian CVL due to increasing  pressor needs.   Given his poor airway protection, decision was made to intubate pt.  Given hemodynamic instability, Dr. Gilford Raid opted for awake intubation using bronchoscope.  This was attempted but significant resistance was met.  While attempting, pt became pulseless.  ACLS protocol was initiated.  He was given 45m epinephrine and was intubated by Dr. SGilford Raidbefore achieving ROSC.  During intubation, partial dentures  were noted down in posterior oropharynx just proximal to the vocal cords. After ROSC, he was moving purposefully and was transferred to the ICU for further evaluation and management.  Update CCM: 5/14 at 1148 Pt rapidly decmopensated thru the early morning hours, escalating care req 5 pressors. Family was eventually reached after numerous attempts and updated via phone and advised to come immediately to hospital should they desire. See code documentation for complete details. Briefly, pt went asystole shortly before 11 am and ACLS started. Wife was at bedside at the time. Wife and daughter updated after ROSC with thready, brady pulse achieved. Pt went asystole again, wife and daughter at bedside acls resumed. Code called 17106    Pertinent Labs and Studies  Significant Diagnostic Studies Dg Shoulder Right  Result Date: 03/21/2019 CLINICAL DATA:  Pain for 2 days EXAM: RIGHT SHOULDER - 2+ VIEW COMPARISON:  None. FINDINGS: Frontal, oblique, Y scapular, and axillary images were obtained. There is no appreciable fracture or dislocation. There is mild widening of the acromioclavicular and coracoclavicular spaces, raising concern for potential separation in these areas. There is no appreciable joint space narrowing. There is mild bony overgrowth along the lateral right clavicle. No intra-articular calcification evident. Visualized right lung clear. IMPRESSION: Question degree of acromioclavicular and coracoclavicular separation. No frank dislocation. No fracture. No appreciable joint space narrowing or erosion. Slight bony overgrowth along lateral right clavicle. Electronically Signed   By: WLowella GripIII M.D.   On: 04/04/2019 09:01   Ct Angio Chest Pe W And/or Wo Contrast  Result Date: 04/06/2019 CLINICAL DATA:  58year old male with right side chest pain and shortness of breath for 2 days. Bilateral lower extremity swelling. EXAM: CT ANGIOGRAPHY CHEST WITH CONTRAST TECHNIQUE: Multidetector CT  imaging of the chest was performed using the standard protocol during bolus administration of intravenous contrast. Multiplanar CT image reconstructions and MIPs were obtained to evaluate the vascular anatomy. CONTRAST:  838mOMNIPAQUE IOHEXOL 350 MG/ML SOLN COMPARISON:  Portable chest earlier today. CT Abdomen and Pelvis 12/05/2014. FINDINGS: Cardiovascular: Adequate contrast bolus timing in the pulmonary arterial tree. Mild respiratory motion. No focal filling defect identified in the pulmonary arteries to suggest acute pulmonary embolism. Cardiomegaly with no pericardial effusion. Negative visible aorta aside from mild calcified atherosclerosis. Mediastinum/Nodes: Negative. Lungs/Pleura: Low lung volumes, lower than in 2016. Crowding of bronchovascular structures bilaterally. Questionable mild septal thickening. No pleural effusion or consolidation. There is a 6 millimeter right lower lobe lung nodule on image 87 of series 6. There is mild thickening along the right minor fissure. Upper Abdomen: Streak artifact in the upper abdomen. There is a small volume of ascites in the upper abdomen which is new since 2016. Hepatic cirrhosis demonstrated on the prior CT. No focal abnormality of the visible liver, spleen, pancreas, adrenal glands or kidneys. Poor definition of the gallbladder. Mild wall thickening of decompressed transverse colon. Musculoskeletal: No acute osseous abnormality identified. Review of the MIP images confirms the above findings. IMPRESSION: 1. No acute pulmonary embolus identified. 2. Low lung volumes. Questionable pulmonary septal thickening. Consider mild interstitial edema. 3. New small volume ascites  in the upper abdomen since 2016, most likely related to progressed cirrhosis given findings on the prior CT Abdomen and Pelvis. Electronically Signed   By: Genevie Ann M.D.   On: 03/29/2019 05:56   Dg Chest Port 1 View  Result Date: April 21, 2019 CLINICAL DATA:  Central line placement EXAM: PORTABLE  CHEST 1 VIEW COMPARISON:  04-21-2019 FINDINGS: New right IJ temporary dialysis catheter tip at the upper SVC level. Left subclavian central line tip at the SVC RA junction level. NG tube extends within the stomach distally. Defibrillator pads overlie the chest. Cardiomegaly again noted with similar vascular congestion and diffuse edema pattern. Small effusions again noted blunting the costophrenic angles. No pneumothorax. IMPRESSION: New right IJ temporary dialysis catheter upper SVC level Stable cardiomegaly and diffuse edema pattern No pneumothorax Electronically Signed   By: Jerilynn Mages.  Shick M.D.   On: 2019/04/21 07:53   Dg Chest Port 1 View  Result Date: 04/21/2019 CLINICAL DATA:  Central line placement. Endotracheal tube placement. Post CPR. EXAM: PORTABLE CHEST 1 VIEW COMPARISON:  Radiograph and chest CT yesterday. FINDINGS: Endotracheal tube tip high in positioning 9.7 cm from the carina. Recommend advancement of 3-4 cm. Left subclavian central line tip in the region of the mid SVC. No pneumothorax, suspect skin fold or external artifact projecting about the lower lateral left hemithorax. Unchanged heart size and mediastinal contours. Progressive vascular congestion and probable pulmonary edema. Minimal blunting of costophrenic angles suggests small effusions. IMPRESSION: 1. Endotracheal tube tip high in positioning 9.7 cm from the carina. Recommend advancement of 3-4 cm. 2. Left subclavian central line tip in the mid SVC. No pneumothorax. 3. Progressive vascular congestion and probable pulmonary edema from yesterday. Electronically Signed   By: Keith Rake M.D.   On: 21-Apr-2019 02:00   Dg Chest Portable 1 View  Result Date: 04/13/2019 CLINICAL DATA:  Hypotension and shoulder pain EXAM: PORTABLE CHEST 1 VIEW COMPARISON:  None. FINDINGS: The heart size and mediastinal contours are within normal limits. Both lungs are clear. The visualized skeletal structures are unremarkable. IMPRESSION: No active  disease. Electronically Signed   By: Ulyses Jarred M.D.   On: 03/22/2019 03:58   Dg Abd Portable 1v  Result Date: April 21, 2019 CLINICAL DATA:  Orogastric tube placement. EXAM: PORTABLE ABDOMEN - 1 VIEW COMPARISON:  None. FINDINGS: Tip and side port of the enteric tube below the diaphragm in the stomach. Additional overlying monitoring devices project over the lower chest and upper abdomen. Mild gaseous distention of bowel loops in the central abdomen. IMPRESSION: Tip and side port of the enteric tube below the diaphragm in the stomach. Electronically Signed   By: Keith Rake M.D.   On: 04-21-2019 02:18   Ir Abdomen US Limited  Result Date: 04/15/2019 CLINICAL DATA:  Decompensated a patent cirrhosis. Evaluate for intra-abdominal ascites and perform ultrasound-guided paracentesis as indicated. EXAM: LIMITED ABDOMEN ULTRASOUND FOR ASCITES TECHNIQUE: Limited ultrasound survey for ascites was performed in all four abdominal quadrants. COMPARISON:  Chest CT-04/15/2019 FINDINGS: Sonographic evaluation of the abdomen is negative for any significant intra-abdominal ascites. As such, no ultrasound-guided paracentesis was attempted. IMPRESSION: No intra-abdominal ascites. Ultrasound-guided paracentesis not attempted. Electronically Signed   By: Sandi Mariscal M.D.   On: 03/29/2019 12:52    Microbiology Recent Results (from the past 240 hour(s))  SARS Coronavirus 2 [facility] order, Performed in [facility] hospital lab)     Status: None   Collection Time: 04/03/2019  3:40 AM  Result Value Ref Range Status   SARS Coronavirus 2  NEGATIVE NEGATIVE Final    Comment: (NOTE) If result is NEGATIVE SARS-CoV-2 target nucleic acids are NOT DETECTED. The SARS-CoV-2 RNA is generally detectable in upper and lower  respiratory specimens during the acute phase of infection. The lowest  concentration of SARS-CoV-2 viral copies this assay can detect is 250  copies / mL. A negative result does not preclude SARS-CoV-2  infection  and should not be used as the sole basis for treatment or other  patient management decisions.  A negative result may occur with  improper specimen collection / handling, submission of specimen other  than nasopharyngeal swab, presence of viral mutation(s) within the  areas targeted by this assay, and inadequate number of viral copies  (<250 copies / mL). A negative result must be combined with clinical  observations, patient history, and epidemiological information. If result is POSITIVE SARS-CoV-2 target nucleic acids are DETECTED. The SARS-CoV-2 RNA is generally detectable in upper and lower  respiratory specimens dur ing the acute phase of infection.  Positive  results are indicative of active infection with SARS-CoV-2.  Clinical  correlation with patient history and other diagnostic information is  necessary to determine patient infection status.  Positive results do  not rule out bacterial infection or co-infection with other viruses. If result is PRESUMPTIVE POSTIVE SARS-CoV-2 nucleic acids MAY BE PRESENT.   A presumptive positive result was obtained on the submitted specimen  and confirmed on repeat testing.  While 2019 novel coronavirus  (SARS-CoV-2) nucleic acids may be present in the submitted sample  additional confirmatory testing may be necessary for epidemiological  and / or clinical management purposes  to differentiate between  SARS-CoV-2 and other Sarbecovirus currently known to infect humans.  If clinically indicated additional testing with an alternate test  methodology [phone]) is advised. The SARS-CoV-2 RNA is generally  detectable in upper and lower respiratory sp ecimens during the acute  phase of infection. The expected result is Negative. Fact Sheet for Patients:  StrictlyIdeas.no Fact Sheet for Healthcare Providers: BankingDealers.co.za This test is not yet approved or cleared by the [place]  FDA and has been authorized for detection and/or diagnosis of SARS-CoV-2 by FDA under an Emergency Use Authorization (EUA).  This EUA will remain in effect (meaning this test can be used) for the duration of the COVID-19 declaration under Section 564(b)(1) of the Act, 21 U.S.C. section 360bbb-3(b)(1), unless the authorization is terminated or revoked sooner. Performed at [facility], [place] [address] [place], [place] [address]   Culture, blood (routine x 2)     Status: None (Preliminary result)   Collection Time: 03/29/2019  5:39 PM  Result Value Ref Range Status   Specimen Description BLOOD LEFT ANTECUBITAL  Final   Special Requests   Final    BOTTLES DRAWN AEROBIC AND ANAEROBIC Blood Culture adequate volume   Culture  Setup Time   Final    GRAM NEGATIVE RODS IN BOTH AEROBIC AND ANAEROBIC BOTTLES CRITICAL VALUE NOTED.  VALUE IS CONSISTENT WITH PREVIOUSLY REPORTED AND CALLED VALUE. Performed at [facility], [place] [address] [place], [place] [address]    Culture GRAM NEGATIVE RODS  Final   Report Status PENDING  Incomplete  Culture, blood (routine x 2)     Status: None (Preliminary result)   Collection Time: 03/29/2019  5:45 PM  Result Value Ref Range Status   Specimen Description BLOOD RIGHT ANTECUBITAL  Final   Special Requests   Final    BOTTLES DRAWN AEROBIC AND ANAEROBIC  Blood Culture results may not be optimal due to an excessive volume of blood received in culture bottles   Culture  Setup Time   Final    GRAM NEGATIVE RODS IN BOTH AEROBIC AND ANAEROBIC BOTTLES Organism ID to follow CRITICAL RESULT CALLED TO, READ BACK BY AND VERIFIED WITH: PHRMD L SEAY @0351  04/06/19 BY S GEZAHEGN Performed at [facility], [place] [address] [place], [place] [address]    Culture GRAM NEGATIVE RODS  Final   Report Status PENDING  Incomplete  Blood Culture ID Panel (Reflexed)     Status: None   Collection Time: 03/23/2019  5:45 PM  Result Value Ref Range Status   Enterococcus  species NOT DETECTED NOT DETECTED Final   Listeria monocytogenes NOT DETECTED NOT DETECTED Final   Staphylococcus species NOT DETECTED NOT DETECTED Final   Staphylococcus aureus (BCID) NOT DETECTED NOT DETECTED Final   Streptococcus species NOT DETECTED NOT DETECTED Final   Streptococcus agalactiae NOT DETECTED NOT DETECTED Final   Streptococcus pneumoniae NOT DETECTED NOT DETECTED Final   Streptococcus pyogenes NOT DETECTED NOT DETECTED Final   Acinetobacter baumannii NOT DETECTED NOT DETECTED Final   Enterobacteriaceae species NOT DETECTED NOT DETECTED Final   Enterobacter cloacae complex NOT DETECTED NOT DETECTED Final   Escherichia coli NOT DETECTED NOT DETECTED Final   Klebsiella oxytoca NOT DETECTED NOT DETECTED Final   Klebsiella pneumoniae NOT DETECTED NOT DETECTED Final   Proteus species NOT DETECTED NOT DETECTED Final   Serratia marcescens NOT DETECTED NOT DETECTED Final   Haemophilus influenzae NOT DETECTED NOT DETECTED Final   Neisseria meningitidis NOT DETECTED NOT DETECTED Final   Pseudomonas aeruginosa NOT DETECTED NOT DETECTED Final   Candida albicans NOT DETECTED NOT DETECTED Final   Candida glabrata NOT DETECTED NOT DETECTED Final   Candida krusei NOT DETECTED NOT DETECTED Final   Candida parapsilosis NOT DETECTED NOT DETECTED Final   Candida tropicalis NOT DETECTED NOT DETECTED Final    Comment: Performed at [facility], [place] [place], [place] [address]  MRSA PCR Screening     Status: None   Collection Time: 04/17/2019  9:17 PM  Result Value Ref Range Status   MRSA by PCR NEGATIVE NEGATIVE Final    Comment:        The GeneXpert MRSA Assay (FDA approved for NASAL specimens only), is one component of a comprehensive MRSA colonization surveillance program. It is not intended to diagnose MRSA infection nor to guide or monitor treatment for MRSA infections. Performed at [facility], [place] [address], [place],  [address]      Lab Basic Metabolic Panel: Recent Labs  Lab 04/12/2019 0323 04/05/2019 1800 Apr 06, 2019 0200 04/06/19 0228 Apr 06, 2019 0310 Apr 06, 2019 0402 06-Apr-2019 0531 04/06/2019 0547  NA 131* 132* 133* 133*  --  132* 136 134*  K 4.2 4.2 6.7* 6.5* 7.0* 6.7* 7.2* 6.8*  CL 102 103 105  --   --   --  100  --   CO2 13* 11* 7*  --   --   --  8*  --   GLUCOSE 305* 113* 179*  --   --   --  126*  --   BUN 33* 42* 43*  --   --   --  43*  --   CREATININE 1.87* 2.72* 3.28*  --   --   --  3.27*  --   CALCIUM 8.1* 8.2* 7.9*  --   --   --  7.5*  --    Liver Function Tests: Recent Labs  Lab 04/05/2019 0323 04/10/2019 1800 04/06/2019 0200  AST 124* 117* 135*  ALT 78* 71* 62*  ALKPHOS 96 70 66  BILITOT 7.5* 7.0* 5.7*  PROT 6.6 6.2* 5.2*  ALBUMIN 1.7* 1.7* 1.5*   No results for input(s): LIPASE, AMYLASE in the last 168 hours. Recent Labs  Lab 04/12/2019 0340  AMMONIA 60*   CBC: Recent Labs  Lab 03/20/2019 0323 2019-04-06 0228 Apr 06, 2019 0402 Apr 06, 2019 0547  WBC 3.0*  --   --   --   NEUTROABS 1.4*  --   --   --   HGB 9.3* 8.5* 8.8* 6.8*  HCT 27.7* 25.0* 26.0* 20.0*  MCV 74.9*  --   --   --   PLT 54*  --   --   --    Cardiac Enzymes: Recent Labs  Lab 04/17/2019 0323  CKTOTAL 124  TROPONINI <0.03   Sepsis Labs: Recent Labs  Lab 04/16/2019 0323 2019/04/06 0200  WBC 3.0*  --   LATICACIDVEN  --  >11.0*      Audria Nine 2019/04/06, 11:44 AM

## 2019-04-18 NOTE — Procedures (Signed)
Arterial Catheter Insertion Procedure Note Randy Vaughan [id number] Nov 02, 1961  Procedure: Insertion of Arterial Catheter Indications: Blood pressure monitoring and Frequent blood sampling  Procedure Details Consent: Unable to obtain consent because of emergent medical necessity. Time Out: Verified patient identification, verified procedure, site/side was marked, verified correct patient position, special equipment/implants available, medications/allergies/relevent history reviewed, required imaging and test results available.  Performed  Maximum sterile technique was used including antiseptics, cap, gloves, gown, hand hygiene, mask and sheet. Skin prep: Chlorhexidine; local anesthetic administered A 20g arterial catheter was placed into the right femoral artery using the Seldinger technique.  Evaluation Blood flow good Complications: No apparent complications Patient did tolerate procedure well.  [name], [place] [facility] Pager: [phone]  or [phone] 03/26/2019, 2:26 AM

## 2019-04-18 NOTE — Significant Event (Signed)
Pt went asystolic, family at bedside at the time (wife stepped out) and ACLS started. Pt already on 400 neo, giapreza, 100 levo, 20 epi and 0.04 vaso, bicarb at 127ml/hr.   See code documentation for complete details. Briefly after 3 rounds of epi ROSC was achieved with rate of 30 and poor BP. Family was updated and brought to room with understanding that he was dying and would not live much longer.   Pt went asystolic again and pt's family were at bedside with acls was resumed. Repeat epi x1 without rosc and code was called.   Pt expired at 1102 with family at bedside.

## 2019-04-18 NOTE — Progress Notes (Signed)
Responded to spiritual consult to support family and staff.  Family at bedside. Per daughter request I prayed for patient and family. No other request were made known.  Provided emotional and spiritual support and ministry of presence to family. Supported Haematologist as needed.  Chaplain available needed.  Venida Jarvis, [place], [facility], Pager [phone]

## 2019-04-18 DEATH — deceased

## 2020-12-12 IMAGING — CT CT ANGIOGRAPHY CHEST
2 of 7 series · 18 of 46 positions shown · IV contrast (APPLIED)
Comparison: Portable chest earlier today. CT Abdomen and Pelvis
12/05/2014.

CLINICAL DATA: 58-year-old male with right side chest pain and
shortness of breath for 2 days. Bilateral lower extremity swelling.

EXAM:
CT ANGIOGRAPHY CHEST WITH CONTRAST
TECHNIQUE: Multidetector CT imaging of the chest was performed using the
standard protocol during bolus administration of intravenous
contrast. Multiplanar CT image reconstructions and MIPs were
obtained to evaluate the vascular anatomy.
CONTRAST:  80mL OMNIPAQUE IOHEXOL 350 MG/ML SOLN

[Series 7: thins · axial · 0.70mm/px · z∈[+1229,+1520]mm · 15 of 469 slices shown]
[im 27/469  lung]
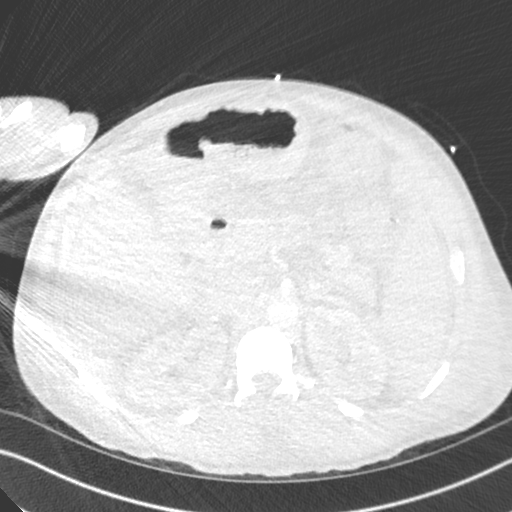
[im 53/469  soft-tissue]
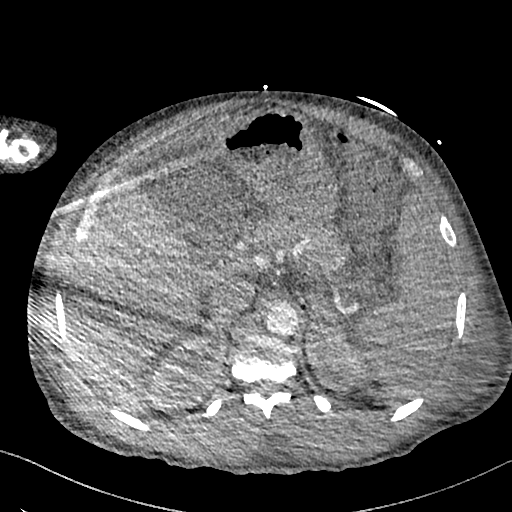
[im 79/469  lung]
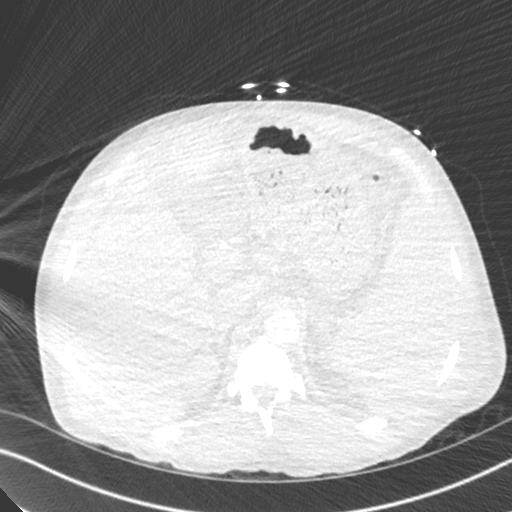
[im 105/469  soft-tissue]
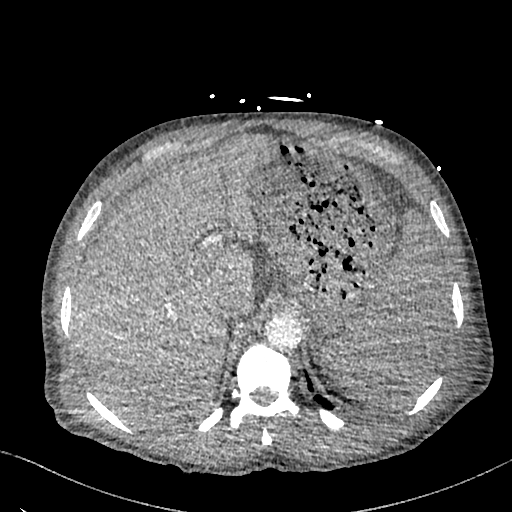
[im 157/469  lung]
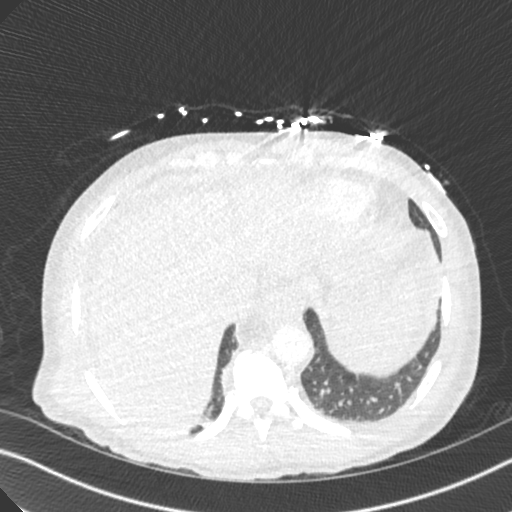
[im 183/469  soft-tissue]
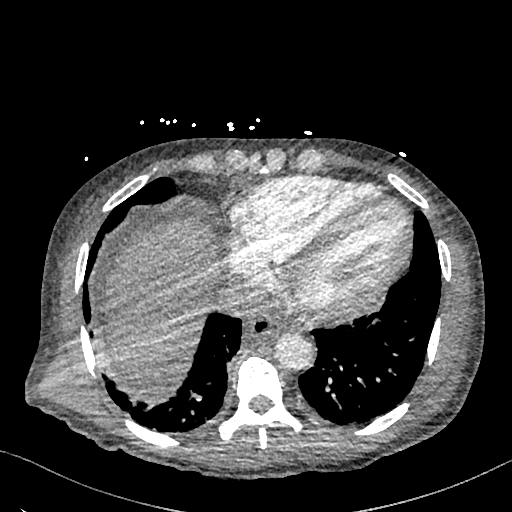
[im 209/469  lung]
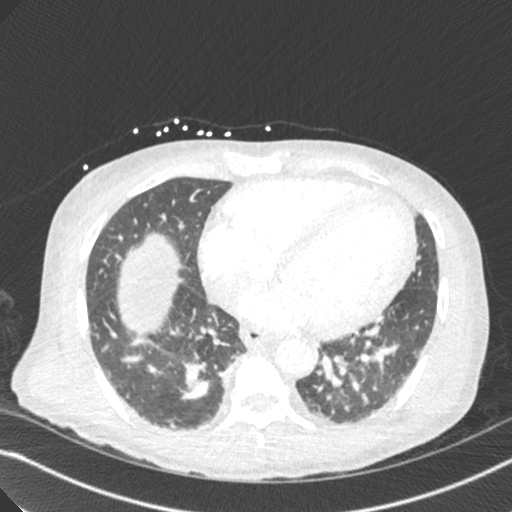
[im 235/469  soft-tissue]
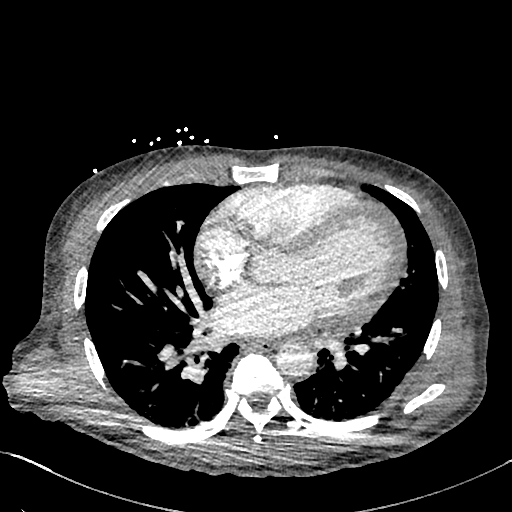
[im 261/469  lung]
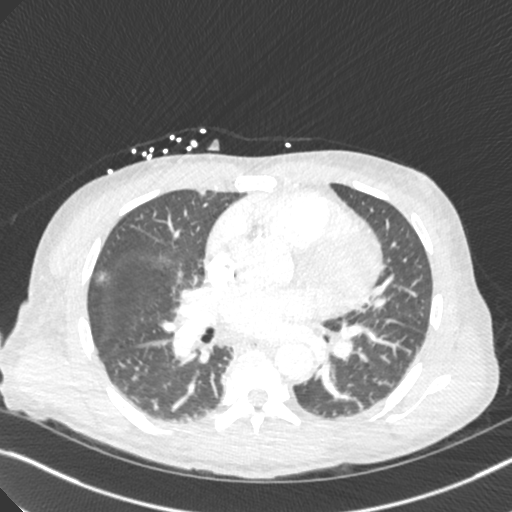
[im 287/469  soft-tissue]
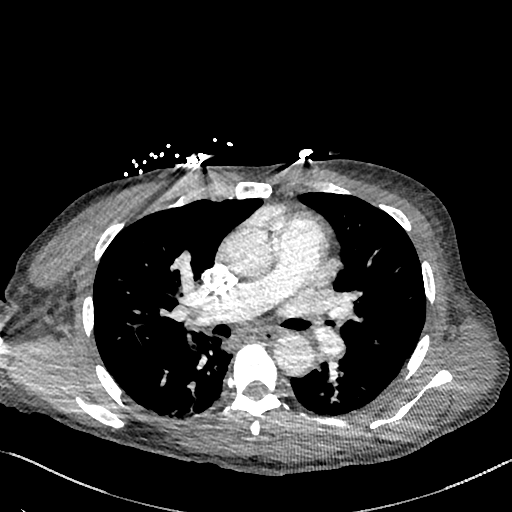
[im 313/469  lung]
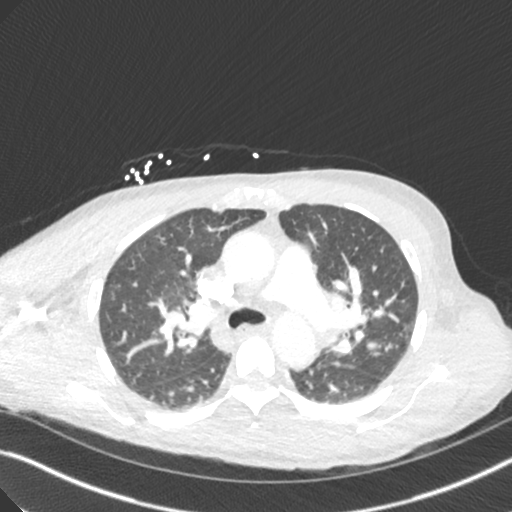
[im 365/469  soft-tissue]
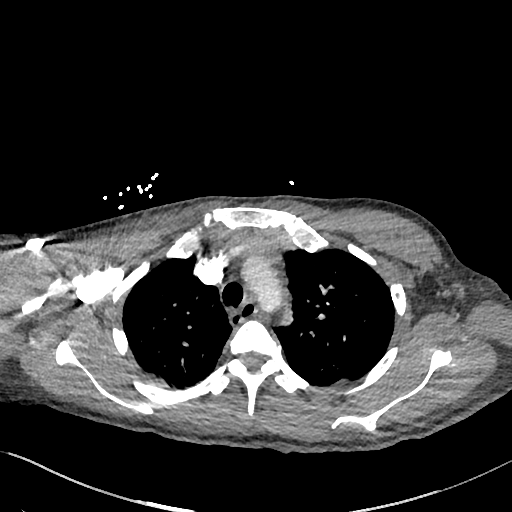
[im 391/469  lung]
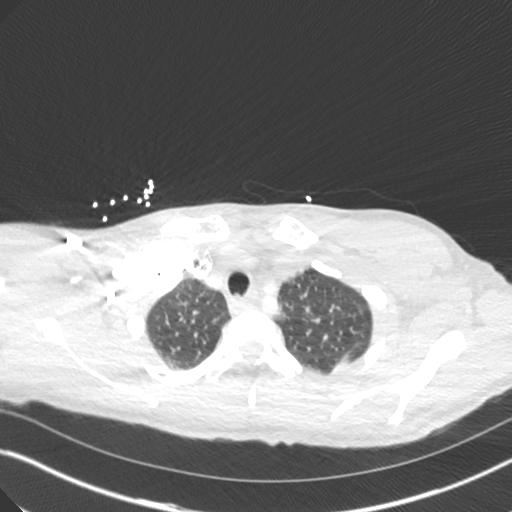
[im 417/469  soft-tissue]
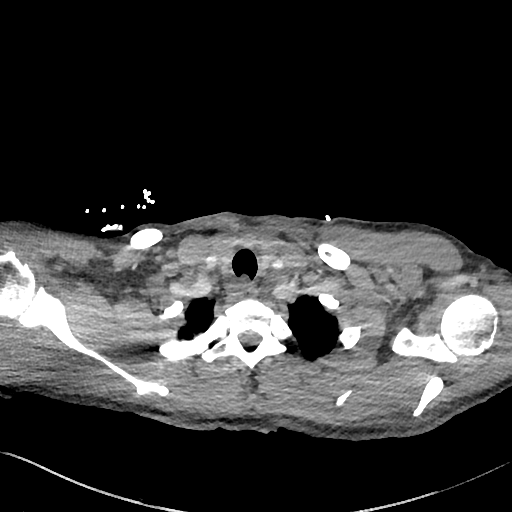
[im 443/469  lung]
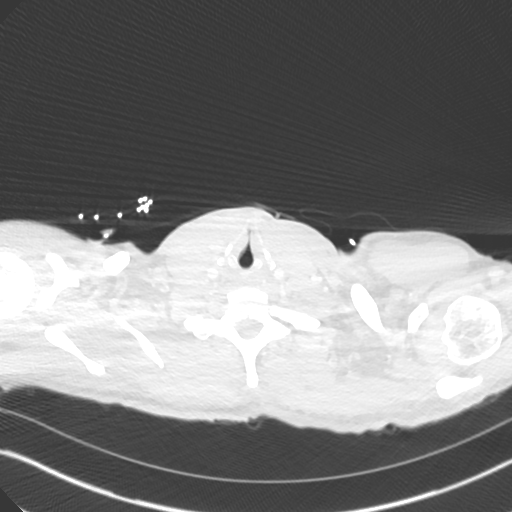

[Series 8: cor · coronal · 0.65mm/px · 3 of 145 slices shown]
[im 37/145  soft-tissue]
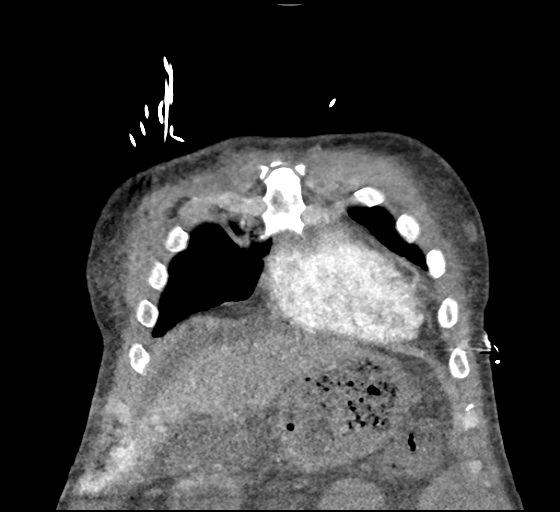
[im 73/145  soft-tissue]
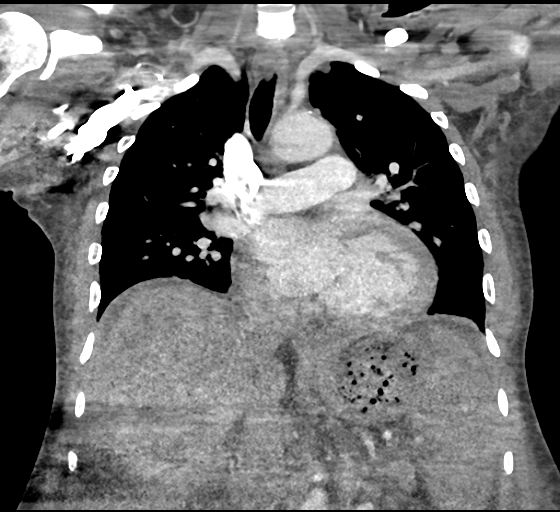
[im 109/145  soft-tissue]
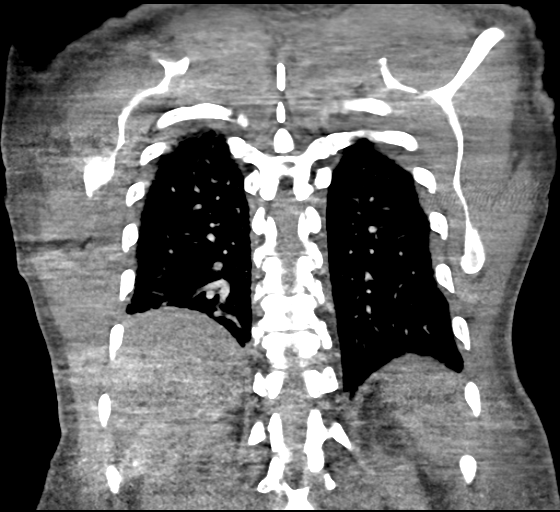

[18 of 46 positions shown; findings below may reference images not displayed]

FINDINGS: Cardiovascular: Adequate contrast bolus timing in the pulmonary
arterial tree. Mild respiratory motion.

No focal filling defect identified in the pulmonary arteries to
suggest acute pulmonary embolism.

Cardiomegaly with no pericardial effusion. Negative visible aorta
aside from mild calcified atherosclerosis.

Mediastinum/Nodes: Negative.

Lungs/Pleura: Low lung volumes, lower than in 0637. Crowding of
bronchovascular structures bilaterally. Questionable mild septal
thickening. No pleural effusion or consolidation. There is a 6
millimeter right lower lobe lung nodule on image 87 of series 6.
There is mild thickening along the right minor fissure.

Upper Abdomen: Streak artifact in the upper abdomen. There is a
small volume of ascites in the upper abdomen which is new since
0637. Hepatic cirrhosis demonstrated on the prior CT. No focal
abnormality of the visible liver, spleen, pancreas, adrenal glands
or kidneys. Poor definition of the gallbladder. Mild wall thickening
of decompressed transverse colon.

Musculoskeletal: No acute osseous abnormality identified.

Review of the MIP images confirms the above findings.
IMPRESSION: 1. No acute pulmonary embolus identified.
2. Low lung volumes. Questionable pulmonary septal thickening.
Consider mild interstitial edema.
3. New small volume ascites in the upper abdomen since 0637, most
likely related to progressed cirrhosis given findings on the prior
CT Abdomen and Pelvis.
